# Patient Record
Sex: Male | Born: 1955
Health system: Southern US, Community
[De-identification: ages and names within clinical notes are randomized; demographics above are authoritative.]

## PROBLEM LIST (undated history)

## (undated) DIAGNOSIS — I201 Angina pectoris with documented spasm: Secondary | ICD-10-CM

## (undated) DIAGNOSIS — R911 Solitary pulmonary nodule: Secondary | ICD-10-CM

## (undated) DIAGNOSIS — Z720 Tobacco use: Secondary | ICD-10-CM

## (undated) DIAGNOSIS — M359 Systemic involvement of connective tissue, unspecified: Secondary | ICD-10-CM

## (undated) DIAGNOSIS — I10 Essential (primary) hypertension: Secondary | ICD-10-CM

## (undated) DIAGNOSIS — I2510 Atherosclerotic heart disease of native coronary artery without angina pectoris: Secondary | ICD-10-CM

---

## 2003-08-20 ENCOUNTER — Emergency Department (HOSPITAL_COMMUNITY): Admission: EM | Admit: 2003-08-20 | Discharge: 2003-08-20 | Payer: Self-pay | Admitting: Emergency Medicine

## 2003-08-20 ENCOUNTER — Encounter: Payer: Self-pay | Admitting: *Deleted

## 2012-07-21 ENCOUNTER — Encounter (HOSPITAL_COMMUNITY): Payer: Self-pay | Admitting: Emergency Medicine

## 2012-07-21 ENCOUNTER — Emergency Department (HOSPITAL_COMMUNITY)
Admission: EM | Admit: 2012-07-21 | Discharge: 2012-07-22 | Disposition: A | Discharge status: Home / Self Care | Payer: Managed Care, Other (non HMO) | Attending: Emergency Medicine | Admitting: Emergency Medicine

## 2012-07-21 ENCOUNTER — Emergency Department (HOSPITAL_COMMUNITY): Payer: Managed Care, Other (non HMO)

## 2012-07-21 DIAGNOSIS — L0390 Cellulitis, unspecified: Secondary | ICD-10-CM

## 2012-07-21 DIAGNOSIS — L02419 Cutaneous abscess of limb, unspecified: Secondary | ICD-10-CM | POA: Insufficient documentation

## 2012-07-21 DIAGNOSIS — F17200 Nicotine dependence, unspecified, uncomplicated: Secondary | ICD-10-CM | POA: Insufficient documentation

## 2012-07-21 LAB — CBC WITH DIFFERENTIAL/PLATELET
Basophils Absolute: 0.1 10*3/uL (ref 0.0–0.1)
Basophils Relative: 1 % (ref 0–1)
Eosinophils Absolute: 0.2 10*3/uL (ref 0.0–0.7)
HCT: 45.5 % (ref 39.0–52.0)
Hemoglobin: 15.6 g/dL (ref 13.0–17.0)
MCV: 95.6 fL (ref 78.0–100.0)
Neutrophils Relative %: 67 % (ref 43–77)
Platelets: 178 10*3/uL (ref 150–400)
RDW: 13.4 % (ref 11.5–15.5)

## 2012-07-21 MED ORDER — IBUPROFEN 800 MG PO TABS
800.0000 mg | ORAL_TABLET | Freq: Once | ORAL | Status: AC
  Administered 2012-07-21: 800 mg via ORAL
  Filled 2012-07-21: qty 1

## 2012-07-21 MED ORDER — HYDROCODONE-ACETAMINOPHEN 5-325 MG PO TABS
1.0000 | ORAL_TABLET | Freq: Once | ORAL | Status: AC
  Administered 2012-07-21: 1 via ORAL
  Filled 2012-07-21: qty 1

## 2012-07-21 NOTE — ED Notes (Signed)
C/o right medial ankle pain with redness and swelling, warm and tender to touch; denies injury; states onset this morning.  Pt states he thinks something bit him last night, causing the redness and swelling. +3 right pedal pulse.  C/o right heel pain x 6 months.

## 2012-07-21 NOTE — ED Provider Notes (Signed)
History   This chart was scribed for [employer]. Colon Branch, MD by Melba Coon. The patient was seen in room APA18/APA18 and the patient's care was started at 11:18PM.    CSN: [id number]  Arrival date & time 07/21/12  2153   First MD Initiated Contact with Patient 07/21/12 2302      Chief Complaint  Patient presents with  . Leg Pain    (Consider location/radiation/quality/duration/timing/severity/associated sxs/prior treatment) The history is provided by the patient. No language interpreter was used.   Christian Lewis is a 56 y.o. male who presents to the Emergency Department complaining of constant, moderate to severe right lower leg pain with an onset this morning. Pt states that he was at an outdoor party and thought that a bug must have bitten him and he did not notice it. No trauma or injury or falls to the affected area. Abx cream, applied alcohol, and "potato" applied to affected area did not alleviate the pain. Fever and chills present. No HA, neck pain, sore throat, rash, back pain, CP, SOB, abd pain, n/v/d, dysuria, or extremity edema, weakness, numbness, or tingling. No prior Hx of gout. No known allergies. No other pertinent medical symptoms.  PCP: Dr. Juanetta Gosling  History reviewed. No pertinent past medical history.  History reviewed. No pertinent past surgical history.  History reviewed. No pertinent family history.  History  Substance Use Topics  . Smoking status: Current Everyday Smoker  . Smokeless tobacco: Not on file  . Alcohol Use: Yes     occ      Review of Systems  Constitutional: Negative for fever.       10 Systems reviewed and are negative for acute change except as noted in the HPI.  HENT: Negative for congestion.   Eyes: Negative for discharge and redness.  Respiratory: Negative for cough and shortness of breath.   Cardiovascular: Negative for chest pain.  Gastrointestinal: Negative for vomiting and abdominal pain.  Musculoskeletal: Negative for back  pain.       Right ankle pain with redness. Right heel pain x 6 months.  Skin: Negative for rash.  Neurological: Negative for syncope, numbness and headaches.  Psychiatric/Behavioral:       No behavior change.     Allergies  Review of patient's allergies indicates no known allergies.  Home Medications   Current Outpatient Rx  Name Route Sig Dispense Refill  . ACETAMINOPHEN 500 MG PO TABS Oral Take 1,000 mg by mouth as needed. For pain      BP 126/74  Pulse 82  Temp 98.7 F (37.1 C) (Oral)  Resp 20  Ht 5\' 7"  (1.702 m)  Wt 135 lb (61.236 kg)  BMI 21.14 kg/m2  SpO2 99%  Physical Exam  Nursing note and vitals reviewed. Constitutional: He is oriented to person, place, and time. He appears well-developed and well-nourished. No distress.  HENT:  Head: Normocephalic and atraumatic.  Right Ear: External ear normal.  Left Ear: External ear normal.  Eyes: EOM are normal.  Neck: Normal range of motion. No tracheal deviation present.  Cardiovascular: Normal rate.   No murmur heard. Pulmonary/Chest: Effort normal. No respiratory distress.  Abdominal: Soft. There is no tenderness.  Musculoskeletal: Normal range of motion. He exhibits edema (Right ankle edema with warmth but no deformity, lesions or bites present.) and tenderness (RLE).       Erythema to right ankle  Neurological: He is alert and oriented to person, place, and time.  Skin: Skin is warm and dry.  No rash noted.  Psychiatric: He has a normal mood and affect. His behavior is normal.    ED Course  Procedures (including critical care time)  DIAGNOSTIC STUDIES: Oxygen Saturation is 99% on room air, normal by my interpretation.   Results for orders placed during the hospital encounter of 07/21/12  CBC WITH DIFFERENTIAL      Component Value Range   WBC 10.3  4.0 - 10.5 K/uL   RBC 4.76  4.22 - 5.81 MIL/uL   Hemoglobin 15.6  13.0 - 17.0 g/dL   HCT 11.9  14.7 - 82.9 %   MCV 95.6  78.0 - 100.0 fL   MCH 32.8  26.0 -  34.0 pg   MCHC 34.3  30.0 - 36.0 g/dL   RDW 56.2  13.0 - 86.5 %   Platelets 178  150 - 400 K/uL   Neutrophils Relative 67  43 - 77 %   Neutro Abs 6.9  1.7 - 7.7 K/uL   Lymphocytes Relative 21  12 - 46 %   Lymphs Abs 2.2  0.7 - 4.0 K/uL   Monocytes Relative 10  3 - 12 %   Monocytes Absolute 1.0  0.1 - 1.0 K/uL   Eosinophils Relative 2  0 - 5 %   Eosinophils Absolute 0.2  0.0 - 0.7 K/uL   Basophils Relative 1  0 - 1 %   Basophils Absolute 0.1  0.0 - 0.1 K/uL  Dg Ankle Complete Right  07/22/2012  *RADIOLOGY REPORT*  Clinical Data: Right ankle pain, swelling, redness since this morning.  No known injury.  Question insect bite.  RIGHT ANKLE - COMPLETE 3+ VIEW  Comparison: None.  Findings: There is soft tissue swelling along the medial aspect of the ankle.  No evidence for acute fracture, subluxation.  The mortise is intact.  Small plantar calcaneal spur is identified.  IMPRESSION: Soft tissue swelling. No evidence for acute osseous abnormality.  Original Report Authenticated By: Patterson Hammersmith, M.D.   COORDINATION OF CARE:   MDM Reviewed: nursing note and vitals Interpretation: x-ray and labs    11:23PM - EDMD will order hydrocodone-acetaminophen, ibuprofen, ankle XR, blood w/u, and UA for the pt.    MDM  Patient with sudden onset of erythema, tenderness and swelling to the right ankle area. NO evidence of insect bite however tender to palpation. Xray without evidence of fracture. Initiated antibiotic therapy. Given analgesic and antiinflammatory. Pt stable in ED with no significant deterioration in condition.The patient appears reasonably screened and/or stabilized for discharge and I doubt any other medical condition or other [facility] requiring further screening, evaluation, or treatment in the ED at this time prior to discharge.  I personally performed the services described in this documentation, which was scribed in my presence. The recorded information has been reviewed and  considered.        Nicoletta Dress. Colon Branch, MD 07/22/12 7846

## 2012-07-21 NOTE — ED Notes (Signed)
Patient states he woke up this morning and couldn't walk due to pain in his right lower leg. States "I was at a birthday party last night and something must have bitten me although I didn't feel anything." Lower right leg is swollen and reddened around ankle area. Also warm to touch. Patient also states "I have been unable to walk on that foot for about 6 months due to my heel hurting." Denies injury.

## 2012-07-21 NOTE — ED Notes (Signed)
Dr. Colon Branch at bedside to examine

## 2012-07-22 MED ORDER — HYDROCODONE-ACETAMINOPHEN 5-325 MG PO TABS: 1.0000 | ORAL_TABLET | ORAL | Status: AC | PRN

## 2012-07-22 MED ORDER — VANCOMYCIN HCL IN DEXTROSE 1-5 GM/200ML-% IV SOLN
1000.0000 mg | Freq: Once | INTRAVENOUS | Status: AC
  Administered 2012-07-22: 1000 mg via INTRAVENOUS
  Filled 2012-07-22: qty 200

## 2012-07-22 MED ORDER — SULFAMETHOXAZOLE-TRIMETHOPRIM 800-160 MG PO TABS: 1.0000 | ORAL_TABLET | Freq: Two times a day (BID) | ORAL | Status: AC

## 2012-07-22 NOTE — ED Notes (Signed)
Left in c/o family for transport home; a&ox4; reports pain improved; instructions/prescriptions reviewed and f/u information provided.  Verbalizes understanding.

## 2013-09-22 ENCOUNTER — Encounter (HOSPITAL_COMMUNITY): Payer: Self-pay | Admitting: *Deleted

## 2013-09-22 ENCOUNTER — Inpatient Hospital Stay (HOSPITAL_COMMUNITY)
Admission: EM | Admit: 2013-09-22 | Discharge: 2013-09-24 | DRG: 249 | Disposition: A | Discharge status: Home / Self Care | Payer: Managed Care, Other (non HMO) | Attending: Cardiovascular Disease | Admitting: Cardiovascular Disease

## 2013-09-22 ENCOUNTER — Emergency Department (HOSPITAL_COMMUNITY): Payer: Managed Care, Other (non HMO)

## 2013-09-22 DIAGNOSIS — I498 Other specified cardiac arrhythmias: Secondary | ICD-10-CM | POA: Diagnosis present

## 2013-09-22 DIAGNOSIS — F17200 Nicotine dependence, unspecified, uncomplicated: Secondary | ICD-10-CM | POA: Diagnosis present

## 2013-09-22 DIAGNOSIS — I201 Angina pectoris with documented spasm: Secondary | ICD-10-CM

## 2013-09-22 DIAGNOSIS — I200 Unstable angina: Secondary | ICD-10-CM | POA: Diagnosis present

## 2013-09-22 DIAGNOSIS — Z720 Tobacco use: Secondary | ICD-10-CM

## 2013-09-22 DIAGNOSIS — R911 Solitary pulmonary nodule: Secondary | ICD-10-CM

## 2013-09-22 DIAGNOSIS — I2510 Atherosclerotic heart disease of native coronary artery without angina pectoris: Secondary | ICD-10-CM

## 2013-09-22 DIAGNOSIS — I249 Acute ischemic heart disease, unspecified: Secondary | ICD-10-CM

## 2013-09-22 HISTORY — DX: Atherosclerotic heart disease of native coronary artery without angina pectoris: I25.10

## 2013-09-22 HISTORY — DX: Systemic involvement of connective tissue, unspecified: M35.9

## 2013-09-22 HISTORY — DX: Angina pectoris with documented spasm: I20.1

## 2013-09-22 HISTORY — DX: Essential (primary) hypertension: I10

## 2013-09-22 HISTORY — DX: Solitary pulmonary nodule: R91.1

## 2013-09-22 HISTORY — DX: Tobacco use: Z72.0

## 2013-09-22 LAB — MRSA PCR SCREENING: MRSA by PCR: NEGATIVE

## 2013-09-22 LAB — BASIC METABOLIC PANEL
BUN: 18 mg/dL (ref 6–23)
Chloride: 102 mEq/L (ref 96–112)
GFR calc Af Amer: >90 mL/min (ref >90)
Potassium: 3.8 mEq/L (ref 3.5–5.1)

## 2013-09-22 LAB — CBC WITH DIFFERENTIAL/PLATELET
Basophils Relative: 1 % (ref 0–1)
Hemoglobin: 14.7 g/dL (ref 13.0–17.0)
Lymphs Abs: 2.8 10*3/uL (ref 0.7–4.0)
MCHC: 34.4 g/dL (ref 30.0–36.0)
Monocytes Relative: 12 % (ref 3–12)
Neutro Abs: 3.5 10*3/uL (ref 1.7–7.7)
Neutrophils Relative %: 46 % (ref 43–77)
RBC: 4.46 MIL/uL (ref 4.22–5.81)
WBC: 7.6 10*3/uL (ref 4.0–10.5)

## 2013-09-22 MED ORDER — ATORVASTATIN CALCIUM 80 MG PO TABS
80.0000 mg | ORAL_TABLET | Freq: Every day | ORAL | Status: DC
  Administered 2013-09-22 – 2013-09-23 (×2): 80 mg via ORAL
  Filled 2013-09-22 (×3): qty 1

## 2013-09-22 MED ORDER — HEPARIN (PORCINE) IN NACL 100-0.45 UNIT/ML-% IJ SOLN: 12.0000 [IU]/kg/h | INTRAMUSCULAR | Status: DC

## 2013-09-22 MED ORDER — ACETAMINOPHEN 325 MG PO TABS
650.0000 mg | ORAL_TABLET | ORAL | Status: DC | PRN
  Administered 2013-09-23 – 2013-09-24 (×2): 650 mg via ORAL
  Filled 2013-09-22 (×2): qty 2

## 2013-09-22 MED ORDER — HEPARIN SODIUM (PORCINE) 5000 UNIT/ML IJ SOLN
60.0000 [IU]/kg | Freq: Once | INTRAMUSCULAR | Status: AC
  Administered 2013-09-22: 3750 [IU] via INTRAVENOUS

## 2013-09-22 MED ORDER — ASPIRIN EC 81 MG PO TBEC
81.0000 mg | DELAYED_RELEASE_TABLET | Freq: Every day | ORAL | Status: DC
  Administered 2013-09-24: 81 mg via ORAL
  Filled 2013-09-22 (×2): qty 1

## 2013-09-22 MED ORDER — SODIUM CHLORIDE 0.9 % IV SOLN: 250.0000 mL | INTRAVENOUS | Status: DC | PRN

## 2013-09-22 MED ORDER — METOPROLOL TARTRATE 25 MG PO TABS
25.0000 mg | ORAL_TABLET | Freq: Once | ORAL | Status: AC
  Administered 2013-09-22: 25 mg via ORAL
  Filled 2013-09-22: qty 1

## 2013-09-22 MED ORDER — SODIUM CHLORIDE 0.9 % IJ SOLN
3.0000 mL | INTRAMUSCULAR | Status: DC | PRN
  Administered 2013-09-23: 3 mL via INTRAVENOUS

## 2013-09-22 MED ORDER — ONDANSETRON HCL 4 MG/2ML IJ SOLN: 4.0000 mg | Freq: Four times a day (QID) | INTRAMUSCULAR | Status: DC | PRN

## 2013-09-22 MED ORDER — HEPARIN (PORCINE) IN NACL 100-0.45 UNIT/ML-% IJ SOLN
INTRAMUSCULAR | Status: AC
  Administered 2013-09-22: 750 [IU]/h via INTRAVENOUS
  Filled 2013-09-22: qty 250

## 2013-09-22 MED ORDER — NITROGLYCERIN IN D5W 200-5 MCG/ML-% IV SOLN
5.0000 ug/min | INTRAVENOUS | Status: DC
  Administered 2013-09-22: 5 ug/min via INTRAVENOUS

## 2013-09-22 MED ORDER — HEPARIN (PORCINE) IN NACL 100-0.45 UNIT/ML-% IJ SOLN
1050.0000 [IU]/h | INTRAMUSCULAR | Status: DC
  Administered 2013-09-22: 750 [IU]/h via INTRAVENOUS
  Filled 2013-09-22 (×2): qty 250

## 2013-09-22 MED ORDER — METOPROLOL TARTRATE 12.5 MG HALF TABLET
12.5000 mg | ORAL_TABLET | Freq: Two times a day (BID) | ORAL | Status: DC
  Filled 2013-09-22 (×3): qty 1

## 2013-09-22 MED ORDER — NITROGLYCERIN IN D5W 200-5 MCG/ML-% IV SOLN
INTRAVENOUS | Status: AC
  Administered 2013-09-22: 5 ug/min via INTRAVENOUS
  Filled 2013-09-22: qty 250

## 2013-09-22 MED ORDER — SODIUM CHLORIDE 0.9 % IJ SOLN: 3.0000 mL | Freq: Two times a day (BID) | INTRAMUSCULAR | Status: DC

## 2013-09-22 MED ORDER — ASPIRIN 81 MG PO CHEW
324.0000 mg | CHEWABLE_TABLET | Freq: Once | ORAL | Status: AC
  Administered 2013-09-22: 324 mg via ORAL
  Filled 2013-09-22: qty 4

## 2013-09-22 MED ORDER — ACTIVE PARTNERSHIP FOR HEALTH OF YOUR HEART BOOK
Freq: Once | Status: AC
  Administered 2013-09-22: 23:00:00
  Filled 2013-09-22: qty 1

## 2013-09-22 MED ORDER — NITROGLYCERIN 0.4 MG SL SUBL
0.4000 mg | SUBLINGUAL_TABLET | SUBLINGUAL | Status: DC | PRN
  Filled 2013-09-22: qty 25

## 2013-09-22 MED ORDER — NITROGLYCERIN IN D5W 200-5 MCG/ML-% IV SOLN: 10.0000 ug/min | Freq: Once | INTRAVENOUS | Status: DC

## 2013-09-22 NOTE — ED Notes (Signed)
Same, still waiting for bed assignment @ [facility]

## 2013-09-22 NOTE — ED Notes (Signed)
Patient with episode of chest pain while performing EKG. Noted EKG changes (ST elevation) during EKG. Pain subsided and EKG normalized. EKG obtained during pain and symptom free. Shown to Dr Fayrene Fearing.

## 2013-09-22 NOTE — H&P (Signed)
Christian Lewis is an 57 y.o. male.   Chief Complaint: chest pain HPI: Christian Lewis is a 57 yo man with PMH of who has been having CP intermittently for the last week. He describes the pain as sharp, radiating to his neck/throat but no real radiation down his arms presently or recently. At times he has had associated hot flashes, dizziness and light-headedness. The episodes last approximately 30 seconds at a time with more than 8 episodes today along. 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 He has no nausea/vomitnig/diarrhea and no fever/chills. He's never had pain like this before. He denies weight loss or change in appetite. He endorses 1-2 ppd since teenager. He has a strong family history of T2DM. He is currently chest pain free.      History reviewed. No pertinent past medical history.  History reviewed. No pertinent past surgical history.  History reviewed. No pertinent family history. Social History:  reports that he has been smoking.  He does not have any smokeless tobacco history on file. He reports that  drinks alcohol. He reports that he does not use illicit drugs. He has family history of T2DM  Allergies: No Known Allergies  Medications Prior to Admission  Medication Sig Dispense Refill  . pseudoephedrine (SUDAFED) 120 MG 12 hr tablet Take 60 mg by mouth daily as needed for congestion.      Marland Kitchen acetaminophen (TYLENOL) 500 MG tablet Take 1,000 mg by mouth as needed. For pain        Results for orders placed during the hospital encounter of 09/22/13 (from the past 48 hour(s))  POCT I-STAT TROPONIN I     Status: None   Collection Time    09/22/13  6:04 PM      Result Value Range   Troponin i, poc 0.01  0.00 - 0.08 ng/mL   Comment 3            Comment: Due to the release kinetics of cTnI,     a negative result within the first hours     of the onset of symptoms does not rule out     myocardial infarction with certainty.     If myocardial  infarction is still suspected,     repeat the test at appropriate intervals.  CBC WITH DIFFERENTIAL     Status: None   Collection Time    09/22/13  6:06 PM      Result Value Range   WBC 7.6  4.0 - 10.5 K/uL   RBC 4.46  4.22 - 5.81 MIL/uL   Hemoglobin 14.7  13.0 - 17.0 g/dL   HCT 16.1  09.6 - 04.5 %   MCV 95.7  78.0 - 100.0 fL   MCH 33.0  26.0 - 34.0 pg   MCHC 34.4  30.0 - 36.0 g/dL   RDW 40.9  81.1 - 91.4 %   Platelets 178  150 - 400 K/uL   Neutrophils Relative % 46  43 - 77 %   Neutro Abs 3.5  1.7 - 7.7 K/uL   Lymphocytes Relative 37  12 - 46 %   Lymphs Abs 2.8  0.7 - 4.0 K/uL   Monocytes Relative 12  3 - 12 %   Monocytes Absolute 0.9  0.1 - 1.0 K/uL   Eosinophils Relative 4  0 - 5 %   Eosinophils Absolute 0.3  0.0 - 0.7 K/uL   Basophils Relative 1  0 -  1 %   Basophils Absolute 0.1  0.0 - 0.1 K/uL  BASIC METABOLIC PANEL     Status: Abnormal   Collection Time    09/22/13  6:06 PM      Result Value Range   Sodium 139  135 - 145 mEq/L   Potassium 3.8  3.5 - 5.1 mEq/L   Chloride 102  96 - 112 mEq/L   CO2 29  19 - 32 mEq/L   Glucose, Bld 115 (*) 70 - 99 mg/dL   BUN 18  6 - 23 mg/dL   Creatinine, Ser 1.61  0.50 - 1.35 mg/dL   Calcium 9.7  8.4 - 09.6 mg/dL   GFR calc non Af Amer >90  >90 mL/min   GFR calc Af Amer >90  >90 mL/min   Comment: (NOTE)     The eGFR has been calculated using the CKD EPI equation.     This calculation has not been validated in all clinical situations.     eGFR's persistently <90 mL/min signify possible Chronic Kidney     Disease.   Dg Chest Portable 1 View  09/22/2013   *RADIOLOGY REPORT*  Clinical Data: Chest pain  PORTABLE CHEST - 1 VIEW  Comparison: None  Findings: Normal cardiac and mediastinal contours. Suggestion of increased density in the paramediastinal right upper lobe.  No consolidative pulmonary opacities. Possible 5 mm left lower lobe pulmonary nodule.  No pleural effusion or pneumothorax.  Regional skeleton is unremarkable.  Biapical  pleural parenchymal thickening.  IMPRESSION: No acute cardiopulmonary process.  Suggestion of density within the paramediastinal right upper lobe likely secondary to vessels. Possible 5 mm left lower lobe pulmonary nodule.  Recommend correlation with PA and lateral chest radiograph when patient able.   Original Report Authenticated By: Annia Belt, M.D    Review of Systems  Constitutional: Negative for fever, chills and weight loss.  HENT: Negative for hearing loss, neck pain and tinnitus.   Eyes: Negative for double vision.  Cardiovascular: Positive for chest pain. Negative for palpitations, orthopnea and leg swelling.  Gastrointestinal: Negative for heartburn, nausea, vomiting, abdominal pain, diarrhea, blood in stool and melena.  Genitourinary: Negative for dysuria, frequency and hematuria.  Musculoskeletal: Negative for myalgias.  Neurological: Negative for dizziness and tingling.  Endo/Heme/Allergies: Negative for environmental allergies. Does not bruise/bleed easily.  Psychiatric/Behavioral: Positive for substance abuse. Negative for depression and suicidal ideas.    Blood pressure 109/65, pulse 50, temperature 97.4 F (36.3 C), temperature source Oral, resp. rate 16, height 5\' 6"  (1.676 m), weight 60.8 kg (134 lb 0.6 oz), SpO2 99.00%. Physical Exam  Nursing note and vitals reviewed. Constitutional: He is oriented to person, place, and time. No distress.  Thin man, fully conversant in NAD  HENT:  Head: Normocephalic and atraumatic.  Nose: Nose normal.  Mouth/Throat: Oropharynx is clear and moist. No oropharyngeal exudate.  Eyes: Conjunctivae and EOM are normal. Pupils are equal, round, and reactive to light. No scleral icterus.  Neck: Normal range of motion. Neck supple. No JVD present. No thyromegaly present.  Cardiovascular: Normal rate, regular rhythm, normal heart sounds and intact distal pulses.  Exam reveals no gallop.   No murmur heard. Respiratory: Effort normal and breath  sounds normal. No respiratory distress. He has no wheezes.  GI: Soft. Bowel sounds are normal. He exhibits no distension. There is no tenderness.  Musculoskeletal: Normal range of motion. He exhibits no edema and no tenderness.  Neurological: He is alert and oriented to person, place, and time.  Coordination normal.  Skin: Skin is warm and dry. No rash noted. He is not diaphoretic. No erythema.  Psychiatric: He has a normal mood and affect. His behavior is normal.    Labs reviewed; wbc 7.6, h/h 14.7/42.7, plt 178, na 139, K 3.8, bun/cr 18/0.82, calcium 9.7 Troponin 0.01 ECG: initial inferior ST elevation, repeat 1 minute later resolved inferior ST elevation with no chest pain Chest x-ray: ? LLL 5 mm nodule on portable  Problem List Chest Pain/Unstable Angina Transient ST elevation on ECG Tobacco abuse ? LLL nodule on portable Assessment/Plan 57 yo man with PMH of tobacco use, family history of T2DM here with recurrent chest pain and transient episode of ST elevation on ECG currently being treated for unstable angina. Differential diagnosis also includes esophageal spasm, GERD, nonobstructive CAD, prinzmetal's angina among other etiologies. I favor a diagnosis of coronary disease with real culprit disease given symptoms, risk factors and transient ECG findings. Currently on heparin/HTG gtt. If symptoms recur low threshold for cath lab activation. Likely LHC in AM. His symptoms are a bit atypical but he has strong risk factors and brief ECG changes.  - continue heparin gtt, NTG gtt - low threshold for LHC urgently if change in symptoms - tsh, bnp, lipid panel, BNP, hba1c - defer Echo given likely LHC in AM with potenital LV-gram - aspirin 81 mg daily; high dose atorvastatin - telemetry - low dose metoprolol to be held for HR < 55-60   Tehillah Cipriani 09/22/2013, 10:26 PM

## 2013-09-22 NOTE — ED Notes (Signed)
Chest pain , that radiates to throat, and both arms

## 2013-09-22 NOTE — Progress Notes (Signed)
ANTICOAGULATION CONSULT NOTE - Initial Consult  Pharmacy Consult for Heparin Indication: chest pain/ACS  No Known Allergies  Patient Measurements: Height: 5\' 6"  (167.6 cm) Weight: 138 lb (62.596 kg) IBW/kg (Calculated) : 63.8  Vital Signs: Temp: 98.7 F (37.1 C) (09/30 1753) Temp src: Oral (09/30 1753) BP: 152/76 mmHg (09/30 1753) Pulse Rate: 70 (09/30 1753)  Labs: No results found for this basename: HGB, HCT, PLT, APTT, LABPROT, INR, HEPARINUNFRC, CREATININE, CKTOTAL, CKMB, TROPONINI,  in the last 72 hours  CrCl is unknown because no creatinine reading has been taken.   Medical History: History reviewed. No pertinent past medical history.  Medications:  Scheduled:  . heparin  60 Units/kg Intravenous Once  . metoprolol tartrate  25 mg Oral Once    Assessment: 57 yo M who presented with chest pain found to have abnormal EKG changes on exam.  Troponin currently wnl.  No history of bleeding noted.  Heparin bolus ordered by EDP.  CBC in progress.   Goal of Therapy:  Heparin level 0.3-0.7 units/ml Monitor platelets by anticoagulation protocol: Yes   Plan:  If platelets > 100K, start heparin infusion at 750 units/hr (12 units/kg/hr) Check 6 hour heparin level after infusion started Daily heparin level & CBC while on heparin infusion  Elson Clan 09/22/2013,6:26 PM

## 2013-09-22 NOTE — ED Provider Notes (Signed)
CSN: [id number]     Arrival date & time 09/22/13  1744 History  This chart was scribed for Roney Marion, MD by Blanchard Kelch, ED Scribe. The patient was seen in room APA18/APA18. Patient's care was started at 5:59 PM.    Chief Complaint  Patient presents with  . Chest Pain   Patient is a 57 y.o. male presenting with chest pain. The history is provided by the patient. No language interpreter was used.  Chest Pain Associated symptoms: dizziness   Associated symptoms: no abdominal pain, no cough, no diaphoresis, no dysphagia, no fatigue, no fever, no headache, no nausea, no shortness of breath and not vomiting     HPI Comments: Christian Lewis is a 57 y.o. male who presents to the Emergency Department complaining of intermittent, worsening bilateral chest pain that began about a week ago. The pain radiates up his throat and down his arms bilaterally. He describes the pain as sharp. He reports getting hot flashes, dizziness and light-headed with the episodes. The episodes typically last about 30 seconds. He reports having 8 episodes of pain today. He is not currently in pain. He denies any recent head injury or trauma. He denies syncope, diaphoresis, nausea, hematuria, or hematemesis. He reports diabetes runs in his family. He denies a family history of heart problems. He denies knowing if he is diabetic or hypertensive. 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 He is a current smoker.   History reviewed. No pertinent past medical history. History reviewed. No pertinent past surgical history. History reviewed. No pertinent family history. History  Substance Use Topics  . Smoking status: Current Every Day Smoker  . Smokeless tobacco: Not on file  . Alcohol Use: Yes     Comment: occ    Review of Systems  Constitutional: Negative for fever, chills, diaphoresis, appetite change and fatigue.  HENT: Negative for sore throat, mouth sores and trouble swallowing.   Eyes: Negative for visual disturbance.   Respiratory: Negative for cough, chest tightness, shortness of breath and wheezing.   Cardiovascular: Positive for chest pain.  Gastrointestinal: Negative for nausea, vomiting, abdominal pain, diarrhea and abdominal distention.  Endocrine: Negative for polydipsia, polyphagia and polyuria.  Genitourinary: Negative for dysuria, frequency and hematuria.  Musculoskeletal: Negative for gait problem.  Skin: Negative for color change, pallor and rash.  Neurological: Positive for dizziness and light-headedness. Negative for syncope and headaches.  Hematological: Does not bruise/bleed easily.  Psychiatric/Behavioral: Negative for behavioral problems and confusion.    Allergies  Review of patient's allergies indicates no known allergies.  Home Medications   Current Outpatient Rx  Name  Route  Sig  Dispense  Refill  . acetaminophen (TYLENOL) 500 MG tablet   Oral   Take 1,000 mg by mouth as needed. For pain          Triage Vitals: BP 152/76  Pulse 70  Temp(Src) 98.7 F (37.1 C) (Oral)  Resp 18  Ht 5\' 6"  (1.676 m)  Wt 138 lb (62.596 kg)  BMI 22.28 kg/m2  SpO2 97%  Physical Exam  Nursing note and vitals reviewed. Constitutional: He is oriented to person, place, and time. He appears well-developed and well-nourished. No distress.  HENT:  Head: Normocephalic.  Eyes: Conjunctivae are normal. Pupils are equal, round, and reactive to light. No scleral icterus.  Neck: Normal range of motion. Neck supple. No JVD present. No tracheal deviation present. No thyromegaly present.  Cardiovascular: Normal rate, regular rhythm and intact distal pulses.  Exam reveals  no gallop and no friction rub.   No murmur heard. Pulmonary/Chest: Effort normal and breath sounds normal. No respiratory distress. He has no wheezes. He has no rales.  Abdominal: Soft. Bowel sounds are normal. He exhibits no distension. There is no tenderness. There is no rebound.  Musculoskeletal: Normal range of motion. He exhibits  no edema.  Neurological: He is alert and oriented to person, place, and time.  Skin: Skin is warm and dry. No rash noted.  Psychiatric: He has a normal mood and affect. His behavior is normal.    ED Course  CRITICAL CARE Performed by: Roney Marion Authorized by: Rolland Porter J Total critical care time: 55 minutes Critical care start time: 09/22/2013 5:55 PM Critical care end time: 09/22/2013 6:50 PM Critical care time was exclusive of separately billable procedures and treating other patients. Critical care was necessary to treat or prevent imminent or life-threatening deterioration of the following conditions: Coronary syndrome with dynamic EKG. Critical care was time spent personally by me on the following activities: development of treatment plan with patient or surrogate, obtaining history from patient or surrogate, ordering and performing treatments and interventions and ordering and review of laboratory studies. Comments: Patient placed on vasoactive infusions with nitroglycerin drip. Heparin bolus and drip.   (including critical care time)  DIAGNOSTIC STUDIES: Oxygen Saturation is 97% on room air, normal by my interpretation.    COORDINATION OF CARE: 6:05 PM -Will order EKG, CBC, BMP, I-Stat troponin, Chest x-ray, and aspirin. Patient verbalizes understanding and agrees with treatment plan.  EKG: #1: ST elevations of 1 mm in leads 2, 3, and aVF.  EKG #2: This is obtained less than 1 minute after the first. His pain resolved. His ST segments had normalized. His ST segments are isoelectric.    Labs Review Labs Reviewed  BASIC METABOLIC PANEL - Abnormal; Notable for the following:    Glucose, Bld 115 (*)    All other components within normal limits  CBC WITH DIFFERENTIAL  HEPARIN LEVEL (UNFRACTIONATED)  CBC  POCT I-STAT TROPONIN I   Imaging Review Dg Chest Portable 1 View  09/22/2013   *RADIOLOGY REPORT*  Clinical Data: Chest pain  PORTABLE CHEST - 1 VIEW  Comparison:  None  Findings: Normal cardiac and mediastinal contours. Suggestion of increased density in the paramediastinal right upper lobe.  No consolidative pulmonary opacities. Possible 5 mm left lower lobe pulmonary nodule.  No pleural effusion or pneumothorax.  Regional skeleton is unremarkable.  Biapical pleural parenchymal thickening.  IMPRESSION: No acute cardiopulmonary process.  Suggestion of density within the paramediastinal right upper lobe likely secondary to vessels. Possible 5 mm left lower lobe pulmonary nodule.  Recommend correlation with PA and lateral chest radiograph when patient able.   Original Report Authenticated By: Annia Belt, M.D    MDM   1. Acute coronary syndrome     I placed a call to Dr. Casimiro Needle he was on-call for invasive cardiology. He returned the call immediately. We discussed the patient. Patient currently has no pain he has a normal EKG. When having episodes of pain he does have ST elevations. The pet these have completely resolved. We decided to not make him a cardiac alert as he is symptom-free. He is given aspirin by mouth, Lopressor by mouth, IV heparin bolus and infusion, and nitroglycerin infusion. He'll be transferred via ground ambulance to come to the CCU in the care of Dr. Shirlee Latch rate remains symptom free.  Discussion:   His chest x-ray shows a  pulmonary nodule and PA and lateral for followup x-rays recommended I discussed this with him. Rest and to followup with her primary care physician regarding this if unable to get PA and lateral chest x-ray during this hospitalization.   I personally performed the services described in this documentation, which was scribed in my presence. The recorded information has been reviewed and is accurate.    Roney Marion, MD 09/22/13 [phone]

## 2013-09-22 NOTE — ED Notes (Signed)
Resting quietly, pain free. Waiting for bed assign @ [facility]. Pt aware of same

## 2013-09-23 ENCOUNTER — Encounter (HOSPITAL_COMMUNITY)
Admission: EM | Disposition: A | Discharge status: Home / Self Care | Payer: Self-pay | Source: Home / Self Care | Attending: Cardiovascular Disease

## 2013-09-23 ENCOUNTER — Encounter (HOSPITAL_COMMUNITY): Payer: Self-pay | Admitting: Nurse Practitioner

## 2013-09-23 ENCOUNTER — Inpatient Hospital Stay (HOSPITAL_COMMUNITY): Payer: Managed Care, Other (non HMO)

## 2013-09-23 DIAGNOSIS — I2510 Atherosclerotic heart disease of native coronary artery without angina pectoris: Secondary | ICD-10-CM

## 2013-09-23 DIAGNOSIS — Z720 Tobacco use: Secondary | ICD-10-CM

## 2013-09-23 DIAGNOSIS — I200 Unstable angina: Secondary | ICD-10-CM

## 2013-09-23 DIAGNOSIS — I2119 ST elevation (STEMI) myocardial infarction involving other coronary artery of inferior wall: Secondary | ICD-10-CM

## 2013-09-23 HISTORY — PX: FRACTIONAL FLOW RESERVE WIRE: SHX5839

## 2013-09-23 HISTORY — PX: PERCUTANEOUS CORONARY STENT INTERVENTION (PCI-S): SHX5485

## 2013-09-23 HISTORY — PX: LEFT HEART CATHETERIZATION WITH CORONARY ANGIOGRAM: SHX5451

## 2013-09-23 LAB — RAPID URINE DRUG SCREEN, HOSP PERFORMED
Amphetamines: NOT DETECTED
Opiates: NOT DETECTED
Tetrahydrocannabinol: NOT DETECTED

## 2013-09-23 LAB — CBC
HCT: 40.6 % (ref 39.0–52.0)
Hemoglobin: 14.2 g/dL (ref 13.0–17.0)
RBC: 4.27 MIL/uL (ref 4.22–5.81)
WBC: 8.7 10*3/uL (ref 4.0–10.5)

## 2013-09-23 LAB — LIPID PANEL
Cholesterol: 118 mg/dL (ref 0–200)
LDL Cholesterol: 65 mg/dL (ref 0–99)
Total CHOL/HDL Ratio: 3.2 RATIO
Triglycerides: 81 mg/dL (ref <150)
VLDL: 16 mg/dL (ref 0–40)

## 2013-09-23 LAB — PROTIME-INR: Prothrombin Time: 13.2 seconds (ref 11.6–15.2)

## 2013-09-23 LAB — BASIC METABOLIC PANEL
CO2: 28 mEq/L (ref 19–32)
Chloride: 105 mEq/L (ref 96–112)
Creatinine, Ser: 0.7 mg/dL (ref 0.50–1.35)
Sodium: 138 mEq/L (ref 135–145)

## 2013-09-23 LAB — HEPARIN LEVEL (UNFRACTIONATED): Heparin Unfractionated: 0.24 IU/mL — ABNORMAL LOW (ref 0.30–0.70)

## 2013-09-23 LAB — POCT ACTIVATED CLOTTING TIME
Activated Clotting Time: 257 seconds
Activated Clotting Time: 262 seconds

## 2013-09-23 LAB — TSH: TSH: 7.79 u[IU]/mL — ABNORMAL HIGH (ref 0.350–4.500)

## 2013-09-23 LAB — TROPONIN I: Troponin I: <0.3 ng/mL (ref <0.30)

## 2013-09-23 LAB — MAGNESIUM: Magnesium: 2.1 mg/dL (ref 1.5–2.5)

## 2013-09-23 LAB — HEMOGLOBIN A1C: Mean Plasma Glucose: 120 mg/dL — ABNORMAL HIGH (ref <117)

## 2013-09-23 SURGERY — LEFT HEART CATHETERIZATION WITH CORONARY ANGIOGRAM
Anesthesia: LOCAL

## 2013-09-23 MED ORDER — SODIUM CHLORIDE 0.9 % IV SOLN: INTRAVENOUS | Status: DC

## 2013-09-23 MED ORDER — SODIUM CHLORIDE 0.9 % IJ SOLN: 3.0000 mL | Freq: Two times a day (BID) | INTRAMUSCULAR | Status: DC

## 2013-09-23 MED ORDER — AMLODIPINE BESYLATE 2.5 MG PO TABS
2.5000 mg | ORAL_TABLET | Freq: Every day | ORAL | Status: DC
  Administered 2013-09-23 – 2013-09-24 (×2): 2.5 mg via ORAL
  Filled 2013-09-23 (×2): qty 1

## 2013-09-23 MED ORDER — HEPARIN (PORCINE) IN NACL 2-0.9 UNIT/ML-% IJ SOLN
INTRAMUSCULAR | Status: AC
  Filled 2013-09-23: qty 1000

## 2013-09-23 MED ORDER — SODIUM CHLORIDE 0.9 % IV SOLN: 250.0000 mL | INTRAVENOUS | Status: DC | PRN

## 2013-09-23 MED ORDER — ASPIRIN 81 MG PO CHEW
324.0000 mg | CHEWABLE_TABLET | ORAL | Status: AC
  Administered 2013-09-23: 324 mg via ORAL

## 2013-09-23 MED ORDER — PRASUGREL HCL 10 MG PO TABS
ORAL_TABLET | ORAL | Status: AC
  Filled 2013-09-23: qty 6

## 2013-09-23 MED ORDER — ISOSORBIDE MONONITRATE ER 30 MG PO TB24
30.0000 mg | ORAL_TABLET | Freq: Every day | ORAL | Status: DC
  Administered 2013-09-23 – 2013-09-24 (×2): 30 mg via ORAL
  Filled 2013-09-23 (×2): qty 1

## 2013-09-23 MED ORDER — SODIUM CHLORIDE 0.9 % IV SOLN
INTRAVENOUS | Status: AC
Start: 2013-09-23 — End: 2013-09-23
  Administered 2013-09-23: 21:00:00 250 mL via INTRAVENOUS

## 2013-09-23 MED ORDER — SODIUM CHLORIDE 0.9 % IJ SOLN
3.0000 mL | Freq: Two times a day (BID) | INTRAMUSCULAR | Status: DC
Start: 2013-09-23 — End: 2013-09-23

## 2013-09-23 MED ORDER — FENTANYL CITRATE 0.05 MG/ML IJ SOLN
INTRAMUSCULAR | Status: AC
  Filled 2013-09-23: qty 2

## 2013-09-23 MED ORDER — VERAPAMIL HCL 2.5 MG/ML IV SOLN
INTRAVENOUS | Status: AC
  Filled 2013-09-23: qty 2

## 2013-09-23 MED ORDER — NITROGLYCERIN 0.2 MG/ML ON CALL CATH LAB
INTRAVENOUS | Status: AC
  Filled 2013-09-23: qty 1

## 2013-09-23 MED ORDER — SODIUM CHLORIDE 0.9 % IJ SOLN: 3.0000 mL | INTRAMUSCULAR | Status: DC | PRN

## 2013-09-23 MED ORDER — ASPIRIN 81 MG PO CHEW
CHEWABLE_TABLET | ORAL | Status: AC
  Filled 2013-09-23: qty 4

## 2013-09-23 MED ORDER — PRASUGREL HCL 10 MG PO TABS
10.0000 mg | ORAL_TABLET | Freq: Every day | ORAL | Status: DC
  Administered 2013-09-24: 11:00:00 10 mg via ORAL
  Filled 2013-09-23: qty 1

## 2013-09-23 MED ORDER — HEPARIN SODIUM (PORCINE) 1000 UNIT/ML IJ SOLN
INTRAMUSCULAR | Status: AC
  Filled 2013-09-23: qty 1

## 2013-09-23 MED ORDER — MIDAZOLAM HCL 2 MG/2ML IJ SOLN
INTRAMUSCULAR | Status: AC
  Filled 2013-09-23: qty 2

## 2013-09-23 MED ORDER — ASPIRIN 81 MG PO CHEW
CHEWABLE_TABLET | ORAL | Status: AC
  Filled 2013-09-23: qty 1

## 2013-09-23 MED ORDER — LIDOCAINE HCL (PF) 1 % IJ SOLN
INTRAMUSCULAR | Status: AC
  Filled 2013-09-23: qty 30

## 2013-09-23 MED ORDER — ADENOSINE 12 MG/4ML IV SOLN
12.0000 mL | Freq: Once | INTRAVENOUS | Status: DC
  Filled 2013-09-23: qty 12

## 2013-09-23 NOTE — Progress Notes (Signed)
TR BAND REMOVAL  LOCATION:  right radial  DEFLATED PER PROTOCOL:  yes  TIME BAND OFF / DRESSING APPLIED:   1415   SITE UPON ARRIVAL:   Level 0  SITE AFTER BAND REMOVAL:  Level 0  REVERSE ALLEN'S TEST:    positive  CIRCULATION SENSATION AND MOVEMENT:  Within Normal Limits  yes  COMMENTS:

## 2013-09-23 NOTE — Progress Notes (Signed)
Notified C.Berg NP of abnormal CXR and CT recommended. He will continue to follow up.

## 2013-09-23 NOTE — Progress Notes (Addendum)
ANTICOAGULATION CONSULT NOTE - Follow Up Consult  Pharmacy Consult for Heparin Indication: chest pain/ACS  No Known Allergies  Patient Measurements: Height: 5\' 6"  (167.6 cm) Weight: 134 lb 0.6 oz (60.8 kg) IBW/kg (Calculated) : 63.8  Vital Signs: Temp: 97.4 F (36.3 C) (10/01 0042) Temp src: Oral (10/01 0042) BP: 93/53 mmHg (10/01 0042) Pulse Rate: 47 (10/01 0042)  Labs:  Recent Labs  09/22/13 1806 09/22/13 2330  HGB 14.7  --   HCT 42.7  --   PLT 178  --   APTT  --  155*  LABPROT  --  13.2  INR  --  1.02  HEPARINUNFRC  --  0.24*  CREATININE 0.82  --   TROPONINI  --  <0.30    Estimated Creatinine Clearance: 84.4 ml/min (by C-G formula based on Cr of 0.82).   Medical History: History reviewed. No pertinent past medical history.  Medications:  Scheduled:  . aspirin EC  81 mg Oral Daily  . atorvastatin  80 mg Oral q1800  . metoprolol tartrate  12.5 mg Oral BID  . sodium chloride  3 mL Intravenous Q12H    Assessment: 57 yo M who presented with chest pain found to have abnormal EKG changes on exam.  Troponin currently wnl.  No history of bleeding noted.  Initial heparin level 0.24 units/ml    Goal of Therapy:  Heparin level 0.3-0.7 units/ml Monitor platelets by anticoagulation protocol: Yes   Plan:  Increase heparin infusion to 900 units/hr Check 6 hour heparin level after rate change   Ericia Moxley Poteet 09/23/2013,12:51 AM  Addum:  Repeat heparin level 0.25 units/ml.  Will increase drip to 1050 units/hr and recheck in 6 hours.

## 2013-09-23 NOTE — CV Procedure (Signed)
Cardiac Catheterization Procedure Note  Name: Christian Lewis MRN: [id number] DOB: 23-Sep-1956  Procedure: Left Heart Cath, Selective Coronary Angiography, LV angiography,  fractional flow reserve interrogation of the proximal RCA,  bare-metal stenting of the right coronary artery  Indication: Unstable angina with transient  inferior ST elevation.  Medications:  Sedation:  1 mg IV Versed, 50 mcg IV Fentanyl  Contrast:  130 ml Omnipaque  Procedural Details: The right wrist was prepped, draped, and anesthetized with 1% lidocaine. Using the modified Seldinger technique, a 5 French Slender sheath was introduced into the right radial artery. 3 mg of verapamil was administered through the sheath, weight-based unfractionated heparin was administered intravenously. A Jackie catheter was used for selective coronary angiography. A pigtail catheter was used for left ventriculography. Catheter exchanges were performed over an exchange length guidewire. There were no immediate procedural complications.  Procedural Findings:  Hemodynamics: AO:  95/46 mmHg LV:  95/2    mmHg LVEDP: 5  mmHg  Coronary angiography: Coronary dominance: Right   Left Main:  Normal  Left Anterior Descending (LAD):  Normal in size with minor irregularities.  1st diagonal (D1):  Small in size with minor irregularities.  2nd diagonal (D2):  Small in size with minor irregularities.  3rd diagonal (D3):  Small in size with minor irregularities.  Circumflex (LCx):  Normal in size and nondominant. The vessel has minor irregularities.  1st obtuse marginal:  Medium in size with no significant disease.  2nd obtuse marginal:  Small in size with no significant disease.  3rd obtuse marginal:  Small in size with no significant disease.    Ramus Intermedius:  Large in size with no significant disease.  Right Coronary Artery: Large in size and dominant. There is a 50-60% discrete proximal stenosis. The rest of the vessel has  minor irregularities.  Posterior descending artery: Large in size with no significant disease.  Posterior AV segment: Normal  Posterolateral branchs:  Normal posterolateral branches.  Left ventriculography: Left ventricular systolic function is normal , LVEF is estimated at 60 %, there is no significant mitral regurgitation   PCI Note:  Following the diagnostic procedure, the decision was made to proceed with hemodynamic evaluation of the lesion in the right coronary artery. ACT was 257. An additional 2000 units of heparin was given before stenting the vessel.  A 6 [place] JR 4 guide catheter was inserted.  A intuition coronary guidewire was used to cross the lesion.  I then used the Acist FFR catheter which was used in the standard fashion and cross the lesion. IV adenosine at 140 mcg per kilogram per minute was started. FFR ratio was 0.77. After this, there was a severe drop in FFR to 0.5 with documented severe spasm in the proximal lesion. Based on this, I decided to treat the lesion.  The lesion was then stented with a 3.5 x 18 mm vision bare-metal stent.  The stent was postdilated with a 4.0 x 12 noncompliant balloon.  Following PCI, there was 0% residual stenosis and TIMI-3 flow. Final angiography confirmed an excellent result. The patient tolerated the procedure well. There were no immediate procedural complications. A TR band was used for radial hemostasis. The patient was transferred to the post catheterization recovery area for further monitoring.  PCI Data: Vessel - proximal RCA/Segment - 1 Percent Stenosis (pre)  60% (significant by FFR) TIMI-flow 3 Stent 3.5 x 18 mm vision bare-metal stent Percent Stenosis (post) 0 TIMI-flow (post) 3   Final Conclusions:  1. Severe  proximal RCA spasm superimposed on a moderate lesion which was significant by FFR interrogation. No other significant coronary disease. 2. Normal LV systolic function and left ventricular end-diastolic pressure. 3.  Successful bare-metal stent placement to the proximal right coronary artery.  Recommendations:  This is a case of severe coronary spasm. I will check urine drug screen. Avoid beta blockers. He is to stop using all kinds of decongestants as he was taking Sudafed. Treat with dual antiplatelet therapy for a minimum of one month. I started small dose Imdur and and amlodipine.  Lorine Bears MD, [facility] 09/23/2013, 11:17 AM

## 2013-09-23 NOTE — Progress Notes (Signed)
Christian Lewis, H 09/23/2013

## 2013-09-23 NOTE — Progress Notes (Addendum)
Patient Name: Christian Lewis Date of Encounter: 09/23/2013    Principal Problem:   Unstable angina Active Problems:   Tobacco abuse   SUBJECTIVE  No chest pain or sob overnight.  CURRENT MEDS . aspirin EC  81 mg Oral Daily  . atorvastatin  80 mg Oral q1800  . metoprolol tartrate  12.5 mg Oral BID  . sodium chloride  3 mL Intravenous Q12H   OBJECTIVE  Filed Vitals:   09/23/13 0300 09/23/13 0500 09/23/13 0530 09/23/13 0600  BP: 90/50 116/57 107/53 117/64  Pulse: 47 46 46 42  Temp:   97.6 F (36.4 C)   TempSrc:   Oral   Resp:   17   Height:      Weight:  134 lb 0.6 oz (60.8 kg)    SpO2: 99% 99% 98% 98%    Intake/Output Summary (Last 24 hours) at 09/23/13 0656 Last data filed at 09/23/13 0600  Gross per 24 hour  Intake    360 ml  Output    600 ml  Net   -240 ml   Filed Weights   09/22/13 1753 09/22/13 2209 09/23/13 0500  Weight: 138 lb (62.596 kg) 134 lb 0.6 oz (60.8 kg) 134 lb 0.6 oz (60.8 kg)    PHYSICAL EXAM  General: Pleasant, NAD. Neuro: Alert and oriented X 3. Moves all extremities spontaneously. Psych: Normal affect. HEENT:  Normal  Neck: Supple without bruits or JVD. Lungs:  Resp regular and unlabored, CTA. Heart: RRR no s3, s4, or murmurs. Abdomen: Soft, non-tender, non-distended, BS + x 4.  Extremities: No clubbing, cyanosis or edema. DP/PT/Radials 2+ and equal bilaterally.  Accessory Clinical Findings  CBC  Recent Labs  09/22/13 1806 09/23/13 0546  WBC 7.6 8.7  NEUTROABS 3.5  --   HGB 14.7 14.2  HCT 42.7 40.6  MCV 95.7 95.1  PLT 178 161   Basic Metabolic Panel  Recent Labs  09/22/13 1806 09/22/13 2330 09/23/13 0546  NA 139  --  138  K 3.8  --  3.8  CL 102  --  105  CO2 29  --  28  GLUCOSE 115*  --  99  BUN 18  --  20  CREATININE 0.82  --  0.70  CALCIUM 9.7  --  8.7  MG  --  2.1  --    Cardiac Enzymes  Recent Labs  09/22/13 2330 09/23/13 0546  TROPONINI <0.30 <0.30   Fasting Lipid Panel  Recent Labs  09/23/13 0546  CHOL 118  HDL 37*  LDLCALC 65  TRIG 81  CHOLHDL 3.2   TELE  Sinus brady into the 40's.  ECG  Pending this AM.  Radiology/Studies  Dg Chest Portable 1 View  09/22/2013   *RADIOLOGY REPORT*  Clinical Data: Chest pain  PORTABLE CHEST - 1 VIEW  Comparison: None  Findings: Normal cardiac and mediastinal contours. Suggestion of increased density in the paramediastinal right upper lobe.  No consolidative pulmonary opacities. Possible 5 mm left lower lobe pulmonary nodule.  No pleural effusion or pneumothorax.  Regional skeleton is unremarkable.  Biapical pleural parenchymal thickening.  IMPRESSION: No acute cardiopulmonary process.  Suggestion of density within the paramediastinal right upper lobe likely secondary to vessels. Possible 5 mm left lower lobe pulmonary nodule.  Recommend correlation with PA and lateral chest radiograph when patient able.   Original Report Authenticated By: Annia Belt, M.D    ASSESSMENT AND PLAN  1.  [place]:  Pt presented yesterday with multiple episodes of  sharp left sided chest pain radiating down his right arm, associated with dyspnea.  Episodes only lasted ~ 30-45 seconds and resolved with rest and deep breathing.  During an episode in the ED, he was noted to have inferior ST elevation with lateral ST depression.  These changes resolved as pain resolved ~ 20 seconds later.  No recurrent c/p overnight on hep/ntg.  CE neg.  Ss and dynamic ECG changes concerning for ischemia.  Plan to pursue diagnostic cath today.  Cont heparin/asa/ntg/statin.  D/c BB in setting of baseline bradycardia.  2.  Tob Abuse:  Cessation advised.  3.  Lipids:  LDL 65 - prev statin naive.  Cont statin for now pending cath.  4.  Abnl CXR: ? 5mm LLL nodule.  F/U PA/Lat as rec.  Signed, Nicolasa Ducking NP   The patient was seen, examined and discussed with Ward Givens, NP.  Agree with the above. 57 year old male, smoker who presented with resting episodic retrosternal  chest pains radiating to the back lasting 45 seconds with documented STE in the inferior leads. Troponin I negative x 2. I just witnessed another of those episodes. He probably has a Prinzmetal angina, that usually has a atherosclerotic substrate. The plan is to perform cath ASAP. Even if findings show non-obstructive CAD he will need aggressive medical management and smoking cessation. He will be started on CCB if vasospasm confermed.  Tobias Alexander, H 09/23/2013

## 2013-09-24 ENCOUNTER — Inpatient Hospital Stay (HOSPITAL_COMMUNITY): Payer: Managed Care, Other (non HMO)

## 2013-09-24 ENCOUNTER — Encounter (HOSPITAL_COMMUNITY): Payer: Self-pay | Admitting: Nurse Practitioner

## 2013-09-24 DIAGNOSIS — R911 Solitary pulmonary nodule: Secondary | ICD-10-CM

## 2013-09-24 DIAGNOSIS — I2510 Atherosclerotic heart disease of native coronary artery without angina pectoris: Secondary | ICD-10-CM

## 2013-09-24 DIAGNOSIS — I201 Angina pectoris with documented spasm: Secondary | ICD-10-CM

## 2013-09-24 LAB — CBC
MCV: 95.9 fL (ref 78.0–100.0)
Platelets: 171 10*3/uL (ref 150–400)
RBC: 4.37 MIL/uL (ref 4.22–5.81)
WBC: 9.4 10*3/uL (ref 4.0–10.5)

## 2013-09-24 LAB — BASIC METABOLIC PANEL
BUN: 17 mg/dL (ref 6–23)
CO2: 26 mEq/L (ref 19–32)
Chloride: 104 mEq/L (ref 96–112)
GFR calc Af Amer: >90 mL/min (ref >90)
GFR calc non Af Amer: >90 mL/min (ref >90)
Potassium: 4 mEq/L (ref 3.5–5.1)
Sodium: 139 mEq/L (ref 135–145)

## 2013-09-24 LAB — T4, FREE: Free T4: 1.03 ng/dL (ref 0.80–1.80)

## 2013-09-24 MED ORDER — NITROGLYCERIN 0.4 MG SL SUBL: 0.4000 mg | SUBLINGUAL_TABLET | SUBLINGUAL | Status: DC | PRN

## 2013-09-24 MED ORDER — ASPIRIN 81 MG PO TBEC: 81.0000 mg | DELAYED_RELEASE_TABLET | Freq: Every day | ORAL | Status: AC

## 2013-09-24 MED ORDER — ISOSORBIDE MONONITRATE ER 30 MG PO TB24: 30.0000 mg | ORAL_TABLET | Freq: Every day | ORAL | Status: DC

## 2013-09-24 MED ORDER — AMLODIPINE BESYLATE 2.5 MG PO TABS: 2.5000 mg | ORAL_TABLET | Freq: Every day | ORAL | Status: DC

## 2013-09-24 MED ORDER — PRASUGREL HCL 10 MG PO TABS: 10.0000 mg | ORAL_TABLET | Freq: Every day | ORAL | Status: DC

## 2013-09-24 MED ORDER — IOHEXOL 300 MG/ML  SOLN
80.0000 mL | Freq: Once | INTRAMUSCULAR | Status: AC | PRN
  Administered 2013-09-24: 80 mL via INTRAVENOUS

## 2013-09-24 MED ORDER — ATORVASTATIN CALCIUM 40 MG PO TABS: 40.0000 mg | ORAL_TABLET | Freq: Every day | ORAL | Status: DC

## 2013-09-24 NOTE — Progress Notes (Signed)
Tele has been alternating between SB and wandering atrial pacemaker, hr 45-50's.  Slept for few hours, now awake, c/o headache.  Tylenol given po.  BP 98/50.

## 2013-09-24 NOTE — Progress Notes (Signed)
CARDIAC REHAB PHASE I   PRE:  Rate/Rhythm: 52 SB    BP: sitting 113/46    SaO2:   MODE:  Ambulation: 700 ft   POST:  Rate/Rhythm: 61 SB    BP: sitting 134/65     SaO2:   Tolerated well, no c/o. Ed completed. Question pts ability to comply to heart healthy living. Sts he is thinking about quitting smoking. Gave him methods for success and [id number] number. Not interested in CRPII. [phone]   Elissa Lovett [place] CES, ACSM 09/24/2013 8:42 AM

## 2013-09-24 NOTE — Progress Notes (Signed)
Patient Name: Christian Lewis Date of Encounter: 09/24/2013   Principal Problem:   Unstable angina Active Problems:   CAD (coronary artery disease)   Coronary vasospasm   Tobacco abuse   SUBJECTIVE  No chest pain or sob overnight.  Hasn't ambulated yet.  CURRENT MEDS . amLODipine  2.5 mg Oral Daily  . aspirin EC  81 mg Oral Daily  . atorvastatin  80 mg Oral q1800  . isosorbide mononitrate  30 mg Oral Daily  . prasugrel  10 mg Oral Daily   OBJECTIVE  Filed Vitals:   09/23/13 2118 09/23/13 2300 09/24/13 0059 09/24/13 0650  BP: 105/49  98/50 107/55  Pulse: 57 48 52 48  Temp: 97.8 F (36.6 C)  98.3 F (36.8 C) 97.7 F (36.5 C)  TempSrc: Oral  Oral Oral  Resp: 16  18 20   Height:      Weight:   135 lb 9.3 oz (61.5 kg)   SpO2: 95% 95% 97% 98%    Intake/Output Summary (Last 24 hours) at 09/24/13 0706 Last data filed at 09/24/13 0653  Gross per 24 hour  Intake 1213.75 ml  Output   2650 ml  Net -1436.25 ml   Filed Weights   09/22/13 2209 09/23/13 0500 09/24/13 0059  Weight: 134 lb 0.6 oz (60.8 kg) 134 lb 0.6 oz (60.8 kg) 135 lb 9.3 oz (61.5 kg)   PHYSICAL EXAM  General: Pleasant, NAD. Neuro: Alert and oriented X 3. Moves all extremities spontaneously. Psych: Normal affect. HEENT:  Normal  Neck: Supple without bruits or JVD. Lungs:  Resp regular and unlabored, CTA. Heart: RRR no s3, s4, or murmurs. Abdomen: Soft, non-tender, non-distended, BS + x 4.  Extremities: No clubbing, cyanosis or edema. DP/PT/Radials 2+ and equal bilaterally.  R wrist w/o bleeding/bruit/hematoma.  Accessory Clinical Findings  CBC  Recent Labs  09/22/13 1806 09/23/13 0546 09/24/13 0544  WBC 7.6 8.7 9.4  NEUTROABS 3.5  --   --   HGB 14.7 14.2 14.5  HCT 42.7 40.6 41.9  MCV 95.7 95.1 95.9  PLT 178 161 171   Basic Metabolic Panel  Recent Labs  09/22/13 1806 09/22/13 2330 09/23/13 0546 09/24/13 0544  NA 139  --  138 139  K 3.8  --  3.8 4.0  CL 102  --  105 104  CO2 29   --  28 26  GLUCOSE 115*  --  99 100*  BUN 18  --  20 17  CREATININE 0.82  --  0.70 0.76  CALCIUM 9.7  --  8.7 8.9  MG  --  2.1  --   --    Cardiac Enzymes  Recent Labs  09/22/13 2330 09/23/13 0546 09/23/13 1230  TROPONINI <0.30 <0.30 <0.30   Hemoglobin A1C  Recent Labs  09/22/13 2330  HGBA1C 5.8*   Fasting Lipid Panel  Recent Labs  09/23/13 0546  CHOL 118  HDL 37*  LDLCALC 65  TRIG 81  CHOLHDL 3.2   Thyroid Function Tests  Recent Labs  09/22/13 2330  TSH 7.790*   TELE  Sb, high 40's to 50's.  ECG  Sb, 48, no acute st/t changes.  Radiology/Studies  Dg Chest 2 View  09/23/2013   *RADIOLOGY REPORT*  Clinical Data: Questioned small nodule in the left lung on prior.  CHEST - 2 VIEW  Comparison: Prior radiograph 09/22/2013  Findings: Normal cardiac and mediastinal contours.  No consolidative pulmonary opacities. Previously questioned nodular density may correspond with a 1 cm nodular  density projecting over the lower lungs on the lateral view.  No pleural effusion or pneumothorax.  Regional skeleton is unremarkable.  IMPRESSION: No acute cardiopulmonary process.  Questioned nodular density may correspond with a 1 cm nodular density projecting over the spine on the lateral view in the region of the lower lungs.  Further evaluation with chest CT is recommended.  These results will be called to the ordering clinician or representative by the Radiologist Assistant, and communication documented in the PACS Dashboard.   Original Report Authenticated By: Annia Belt, M.D   ASSESSMENT AND PLAN  1.  [place]/CAD/Coronary Vasospasm:  S/p PCI/BMS to the proximal RCA yesterday.  No recurrent chest pain.  Cont asa, effient, statin, ccb, nitrate.  2.  Lung Nodule:  1mm nodule noted on PA/Lat CXR yesterday.  Radiology recs f/u CT.  Will order this AM.  3.  Tob Abuse:  Cessation advised.  4.  Elevated TSH:  F/u Free T4.  5.  Lipids:  LDL 65 (TC 118).  Cont statin in setting of  #1.  Signed, Nicolasa Ducking NP    The patient was seen, examined and discussed with Ward Givens, PA-C and I agree with the above.  Tobias Alexander, H 09/24/2013

## 2013-09-24 NOTE — Discharge Summary (Signed)
Patient ID: Christian Lewis,  MRN: [id number], DOB/AGE: 1956/04/30 57 y.o.  Admit date: 09/22/2013 Discharge date: 09/24/2013  Primary Care Provider: HAWKINS,EDWARD L Primary Cardiologist: Pt will w/u in [place]  Discharge Diagnoses Principal Problem:   Unstable angina  **s/p PCI/BMS of the RCA this admission.  Active Problems:   CAD (coronary artery disease)   Coronary vasospasm   Tobacco abuse   Pulmonary nodule  **Needs f/u CT of chest with contrast in 6-12 months.  Allergies No Known Allergies  Procedures  PA & Lateral CXR 10.1.2014  IMPRESSION: No acute cardiopulmonary process.  Questioned nodular density may correspond with a 1 cm nodular density projecting over the spine on the lateral view in the region of the lower lungs.  Further evaluation with chest CT is recommended. _____________   Cardiac Catheterization and Percutaneous Coronary Intervention 10.1.2014  Coronary angiography: Coronary dominance: Right     Left Main:  Normal  Left Anterior Descending (LAD):  Normal in size with minor irregularities.  1st diagonal (D1):  Small in size with minor irregularities.  2nd diagonal (D2):  Small in size with minor irregularities.  3rd diagonal (D3):  Small in size with minor irregularities.  Circumflex (LCx):  Normal in size and nondominant. The vessel has minor irregularities.  1st obtuse marginal:  Medium in size with no significant disease.  2nd obtuse marginal:  Small in size with no significant disease.  3rd obtuse marginal:  Small in size with no significant disease.       Ramus Intermedius:  Large in size with no significant disease.  Right Coronary Artery: Large in size and dominant. There is a 50-60% discrete proximal stenosis. The rest of the vessel has minor irregularities.  Posterior descending artery: Large in size with no significant disease.  Posterior AV segment: Normal  Posterolateral branchs:  Normal posterolateral  branches.    **Fractional Flow Reserve was performed within the RCA.  FFR dropped from 0.77 to 0.5 with adenosine administration secondary to severe spasm within the proximal lesion.  The proximal RCA was then stented with a 3.5 x 18 mm Vision bare metal stent.  Left ventriculography: Left ventricular systolic function is normal , LVEF is estimated at 60 %, there is no significant mitral regurgitation  _____________   CT of the Chest with Contrast 10.2.2014  IMPRESSION: 1. No acute findings identified.  2. Small nonspecific pulmonary nodules are noted in the left lower Lobe (4 mm LLL parenchymal nodule and a 6 mm nodule in the left base). If the patient is at high risk for bronchogenic carcinoma, follow-up chest CT at 6-12 months is recommended. If the patient is at low risk for bronchogenic carcinoma, follow-up chest CT at 12 months is recommended.  _____________   History of Present Illness  57 year old male with prior history of tobacco abuse but without prior history of coronary artery disease. He was in his usual state of health until approximately one week prior to admission when he began to experience intermittent chest discomfort, initially occurring with activity but later occurring at rest as well. Symptoms were described as sharp, fleeting pain, and dyspnea lasting approximately 30-45 seconds, and resolving spontaneously. He had multiple episodes on the day of admission prompting him to present to the [facility] where, during a brief episode of chest pain, ECG showed inferior ST segment elevation which resolved within 30 seconds, with resolution of symptoms. Patient was placed on aspirin and heparin therapy and admitted for further evaluation.  Hospital Course  Patient  ruled out for myocardial infarction. He had recurrent chest pain on the morning of October 1 and was noted upon entry at that time to recurrent ST elevation which was again transient. Decision was made to pursue  semiurgent diagnostic catheterization which revealed predominantly nonobstructive coronary artery disease with a 50-60% within the proximal right coronary artery. fractional flow reserve was performed within the lesion in the right coronary artery and reduced from 0.77-0.5 with adenosine administration secondary to severe spasm at the lesion site. Decision was made at that point to pursue percutaneous intervention and a 3.5 x 18 mm vision bare-metal stent was successfully placed and post dilated up to 4 mm. Patient tolerated this procedure well and post procedure has had no recurrence of chest pain.  He has been ambulating without difficulty.   Of note, portable chest x-ray performed in the emergency department suggested a possible 5 mm left lower lobe nodule. A followup PA and lateral chest x-ray showed a 1 cm nodular density projecting over the spine on the lateral view in the region of the lower lungs. At that point radiology recommended CT with contrast to further assess this nodule. CT with contrast of the chest was performed this morning and showed a 4 mm LLL parenchymal nodule and a 6 mm nodule in the left base.  Radiology recommends follow-up CT of the Chest with contrast in 6-12 months, given his h/o tobacco abuse.  Patient has been notified of results and understands.  He will be discharged home today in good condition.  Discharge Vitals Blood pressure 134/65, pulse 59, temperature 97.7 F (36.5 C), temperature source Oral, resp. rate 20, height 5\' 6"  (1.676 m), weight 135 lb 9.3 oz (61.5 kg), SpO2 98.00%.  Filed Weights   09/22/13 2209 09/23/13 0500 09/24/13 0059  Weight: 134 lb 0.6 oz (60.8 kg) 134 lb 0.6 oz (60.8 kg) 135 lb 9.3 oz (61.5 kg)   Labs  CBC  Recent Labs  09/22/13 1806 09/23/13 0546 09/24/13 0544  WBC 7.6 8.7 9.4  NEUTROABS 3.5  --   --   HGB 14.7 14.2 14.5  HCT 42.7 40.6 41.9  MCV 95.7 95.1 95.9  PLT 178 161 171   Basic Metabolic Panel  Recent Labs   40/10/27 1806 09/22/13 2330 09/23/13 0546 09/24/13 0544  NA 139  --  138 139  K 3.8  --  3.8 4.0  CL 102  --  105 104  CO2 29  --  28 26  GLUCOSE 115*  --  99 100*  BUN 18  --  20 17  CREATININE 0.82  --  0.70 0.76  CALCIUM 9.7  --  8.7 8.9  MG  --  2.1  --   --    Cardiac Enzymes  Recent Labs  09/22/13 2330 09/23/13 0546 09/23/13 1230  TROPONINI <0.30 <0.30 <0.30   Hemoglobin A1C  Recent Labs  09/22/13 2330  HGBA1C 5.8*   Fasting Lipid Panel  Recent Labs  09/23/13 0546  CHOL 118  HDL 37*  LDLCALC 65  TRIG 81  CHOLHDL 3.2   Thyroid Function Tests  Recent Labs  09/22/13 2330  TSH 7.790*   Disposition  Pt is being discharged home today in good condition.  Follow-up Plans & Appointments      Follow-up Information   Follow up with Jacolyn Reedy, PA-C On 09/30/2013. (1:00 PM)    Contact information:   [facility] [address] [place] [address] [phone]     Discharge  Medications    Medication List    STOP taking these medications       pseudoephedrine 120 MG 12 hr tablet  Commonly known as:  SUDAFED      TAKE these medications       acetaminophen 500 MG tablet  Commonly known as:  TYLENOL  Take 1,000 mg by mouth as needed. For pain     amLODipine 2.5 MG tablet  Commonly known as:  NORVASC  Take 1 tablet (2.5 mg total) by mouth daily.     aspirin 81 MG EC tablet  Take 1 tablet (81 mg total) by mouth daily.     atorvastatin 40 MG tablet  Commonly known as:  LIPITOR  Take 1 tablet (40 mg total) by mouth daily at 6 PM.     isosorbide mononitrate 30 MG 24 hr tablet  Commonly known as:  IMDUR  Take 1 tablet (30 mg total) by mouth daily.     nitroGLYCERIN 0.4 MG SL tablet  Commonly known as:  NITROSTAT  Place 1 tablet (0.4 mg total) under the tongue every 5 (five) minutes x 3 doses as needed for chest pain.     prasugrel 10 MG Tabs tablet  Commonly known as:  EFFIENT  Take 1 tablet (10 mg total) by mouth daily.        Outstanding Labs/Studies  Follow-up Chest CT with contrast in 6-12 mos related to LLL pulmonary nodules.  Duration of Discharge Encounter   Greater than 30 minutes including physician time.  Signed, Christian Ducking NP 09/24/2013, 1:34 PM    The patient was seen, examined and discussed with Ward Givens, PA-C and I agree with the above.  Christian Lewis, H 09/24/2013

## 2013-09-30 ENCOUNTER — Encounter: Payer: Self-pay | Admitting: Physician Assistant

## 2013-09-30 ENCOUNTER — Encounter: Payer: Self-pay | Admitting: *Deleted

## 2013-09-30 ENCOUNTER — Ambulatory Visit (INDEPENDENT_AMBULATORY_CARE_PROVIDER_SITE_OTHER): Payer: Managed Care, Other (non HMO) | Admitting: Physician Assistant

## 2013-09-30 VITALS — BP 96/55 | HR 64 | Ht 67.0 in | Wt 135.8 lb

## 2013-09-30 DIAGNOSIS — Z720 Tobacco use: Secondary | ICD-10-CM

## 2013-09-30 DIAGNOSIS — F17200 Nicotine dependence, unspecified, uncomplicated: Secondary | ICD-10-CM

## 2013-09-30 DIAGNOSIS — I201 Angina pectoris with documented spasm: Secondary | ICD-10-CM

## 2013-09-30 DIAGNOSIS — I2510 Atherosclerotic heart disease of native coronary artery without angina pectoris: Secondary | ICD-10-CM

## 2013-09-30 DIAGNOSIS — R911 Solitary pulmonary nodule: Secondary | ICD-10-CM

## 2013-09-30 NOTE — Patient Instructions (Addendum)
Your physician recommends that you schedule a follow-up appointment in: 1 month  Your physician recommends that you return for lab work in 1 month. LFT/LIPID  Your physician recommends you quit smoking.

## 2013-09-30 NOTE — Assessment & Plan Note (Signed)
Smoking cessation discussed 

## 2013-09-30 NOTE — Assessment & Plan Note (Addendum)
He was found to have severe proximal RCA spasm superimposed on a moderate lesion which was significant by FFR interrogation. He was treated with bare-metal stent to the proximal RCA. He had normal LV function and LV and diastolic pressure. Patient is on Imdur, Effient and Norvasc. Beta blockers and decongestants are to be avoided. He has had no further chest pain. Continue current treatment. Will give him a note to return to work. We'll check fasting lipid panel and LFT's  in 4 weeks

## 2013-09-30 NOTE — Progress Notes (Signed)
HPI:  This is a 57 year old male patient who was recently hospitalized with unstable angina and was found to have severe proximal RCA spasm superimposed on a moderate lesion which was significant by FFR interrogation. He was treated with bare-metal stent to the proximal RCA. He had normal LV function and LV and diastolic pressure. It was recommended that beta blockers be avoided and he stopped using all kinds of decongestants. He was placed on Imdur and amlodipine.  The patient was also found to have a 4 mm left lower lobe parenchymal nodule and a 6 mm nodule in the left base. Radiology recommends a followup CT of the chest with contrast in 6-12 months given his tobacco history.  The patient has done quite well since she's been home from the hospital. He denies any chest pain, palpitations, dyspnea, dyspnea on exertion, or presyncope. He actually tried to go back to work but they turned him down because he needs a note. He has decreased his cigarette intake to 6-7 cigarettes a day. He says he is trying to quit. No Known Allergies  Current Outpatient Prescriptions on File Prior to Visit: acetaminophen (TYLENOL) 500 MG tablet, Take 1,000 mg by mouth as needed. For pain, Disp: , Rfl:  amLODipine (NORVASC) 2.5 MG tablet, Take 1 tablet (2.5 mg total) by mouth daily., Disp: 30 tablet, Rfl: 6 aspirin EC 81 MG EC tablet, Take 1 tablet (81 mg total) by mouth daily., Disp: , Rfl:  atorvastatin (LIPITOR) 40 MG tablet, Take 1 tablet (40 mg total) by mouth daily at 6 PM., Disp: 30 tablet, Rfl: 6 isosorbide mononitrate (IMDUR) 30 MG 24 hr tablet, Take 1 tablet (30 mg total) by mouth daily., Disp: 30 tablet, Rfl: 6 nitroGLYCERIN (NITROSTAT) 0.4 MG SL tablet, Place 1 tablet (0.4 mg total) under the tongue every 5 (five) minutes x 3 doses as needed for chest pain., Disp: 25 tablet, Rfl: 3 prasugrel (EFFIENT) 10 MG TABS tablet, Take 1 tablet (10 mg total) by mouth daily., Disp: 30 tablet, Rfl: 6  No current  facility-administered medications on file prior to visit.   Past Medical History:   CAD (coronary artery disease)                                  Comment:a. 09/2013 Cath/PCI: LM nl, LAD min irregs,               D1/2/3 small, min irregs, LCX min irregs,               OM1/2/3 nl, RI nl, RCA 50-60p w severe spasm on              FFR (0.77->0.5)-->3.5x18 Vision BMS, EF 60%.   Coronary vasospasm                                           Lung nodule                                                    Comment:a. 09/2013 CT chest with contrast: 4mm and 6mm               LLL nodules **Needs f/u CT  in 6-12 mos.   Tobacco abuse                                                Collagen vascular disease                                    Hypertension                                                No past surgical history on file.  No family history on file.   Social History   Marital Status: Married             Spouse Name:                      Years of Education:                 Number of children:             Occupational History   None on file  Social History Main Topics   Smoking Status: Current Every Day Smoker        Packs/Day: 0.00  Years:         Smokeless Status: Not on file                      Alcohol Use: Yes               Comment: occ   Drug Use: No             Sexual Activity: Not on file        Other Topics            Concern   None on file  Social History Narrative   None on file    ROS: See history of present illness otherwise negative   PHYSICAL EXAM: Well-nournished, in no acute distress. Neck: No JVD, HJR, Bruit, or thyroid enlargement  Lungs: Decreased breath sounds but No tachypnea, clear without wheezing, rales, or rhonchi  Cardiovascular: RRR, PMI not displaced, heart sounds normal, no murmurs, gallops, bruit, thrill, or heave.  Abdomen: BS normal. Soft without organomegaly, masses, lesions or tenderness.  Extremities: Right arm without hematoma or  hemorrhage at cath site, good radial and brachial pulses, lower extremities without cyanosis, clubbing or edema. Good distal pulses bilateral  SKin: Warm, no lesions or rashes   Musculoskeletal: No deformities  Neuro: no focal signs  BP 96/55  Pulse 64  Ht 5\' 7"  (1.702 m)  Wt 135 lb 12 oz (61.576 kg)  BMI 21.26 kg/m2    EKG: Normal sinus rhythm  Cardiac cath 09-23-13 Final Conclusions:  1. Severe proximal RCA spasm superimposed on a moderate lesion which was significant by FFR interrogation. No other significant coronary disease. 2. Normal LV systolic function and left ventricular end-diastolic pressure. 3. Successful bare-metal stent placement to the proximal right coronary artery.  Recommendations:  This is a case of severe coronary spasm. I will check urine drug screen. Avoid beta blockers. He is to stop using all kinds of decongestants as he was taking Sudafed. Treat  with dual antiplatelet therapy for a minimum of one month. I started small dose Imdur and and amlodipine.  Lorine Bears MD, [facility] CT with contrast of the chest was performed this morning and showed a 4 mm LLL parenchymal nodule and a 6 mm nodule in the left base.  Radiology recommends follow-up CT of the Chest with contrast in 6-12 months, given his h/o tobacco abuse.  Patient has been notified of results and understands

## 2013-09-30 NOTE — Assessment & Plan Note (Signed)
Followup CT with contrast in 6 months

## 2013-10-21 ENCOUNTER — Encounter: Payer: Self-pay | Admitting: Cardiovascular Disease

## 2013-10-21 ENCOUNTER — Ambulatory Visit (INDEPENDENT_AMBULATORY_CARE_PROVIDER_SITE_OTHER): Payer: Managed Care, Other (non HMO) | Admitting: Cardiovascular Disease

## 2013-10-21 VITALS — BP 105/61 | HR 75 | Ht 67.0 in | Wt 134.8 lb

## 2013-10-21 DIAGNOSIS — Z789 Other specified health status: Secondary | ICD-10-CM

## 2013-10-21 DIAGNOSIS — Z888 Allergy status to other drugs, medicaments and biological substances status: Secondary | ICD-10-CM

## 2013-10-21 DIAGNOSIS — E785 Hyperlipidemia, unspecified: Secondary | ICD-10-CM

## 2013-10-21 DIAGNOSIS — I201 Angina pectoris with documented spasm: Secondary | ICD-10-CM

## 2013-10-21 DIAGNOSIS — I2510 Atherosclerotic heart disease of native coronary artery without angina pectoris: Secondary | ICD-10-CM

## 2013-10-21 MED ORDER — ROSUVASTATIN CALCIUM 5 MG PO TABS: 5.0000 mg | ORAL_TABLET | Freq: Every day | ORAL | Status: DC

## 2013-10-21 MED ORDER — PRASUGREL HCL 10 MG PO TABS: 10.0000 mg | ORAL_TABLET | Freq: Every day | ORAL | Status: DC

## 2013-10-21 NOTE — Patient Instructions (Signed)
   Your physician recommends that you schedule a follow-up appointment in: 4 months. Your physician has recommended you make the following change in your medication: STOP LIPITOR. START CRESTOR 5 MG DAILY. Your new prescription has been sent to your pharmacy. All other medications will remain the same.

## 2013-10-21 NOTE — Progress Notes (Signed)
Patient ID: Christian Lewis, male   DOB: 09-07-1956, 57 y.o.   MRN: [id number]      SUBJECTIVE: This is a 57 year old male patient who was recently hospitalized with unstable angina and was found to have severe proximal RCA spasm superimposed on a moderate lesion which was significant by FFR interrogation. He was treated with a bare-metal stent to the proximal RCA. He had normal LV function and LV and diastolic pressure. It was recommended that beta blockers be avoided and he stopped using all kinds of decongestants. He was placed on Imdur and amlodipine.  The patient was also found to have a 4 mm left lower lobe parenchymal nodule and a 6 mm nodule in the left base. Radiology recommends a followup CT of the chest with contrast in 6-12 months given his tobacco history.  He currently denies any chest pain, palpitations, dyspnea, dyspnea on exertion, or presyncope. He has decreased his cigarette intake to 6-7 cigarettes a day. He says he is trying to quit.  However, he has been experiencing diffuse muscle and joint aching since starting these new medications, and he says the Lipitor causes him fatigue and scalp and neck aching.    No Known Allergies  Current Outpatient Prescriptions  Medication Sig Dispense Refill  . amLODipine (NORVASC) 2.5 MG tablet Take 1 tablet (2.5 mg total) by mouth daily.  30 tablet  6  . aspirin EC 81 MG EC tablet Take 1 tablet (81 mg total) by mouth daily.      Marland Kitchen atorvastatin (LIPITOR) 40 MG tablet Take 1 tablet (40 mg total) by mouth daily at 6 PM.  30 tablet  6  . isosorbide mononitrate (IMDUR) 30 MG 24 hr tablet Take 1 tablet (30 mg total) by mouth daily.  30 tablet  6  . nitroGLYCERIN (NITROSTAT) 0.4 MG SL tablet Place 1 tablet (0.4 mg total) under the tongue every 5 (five) minutes x 3 doses as needed for chest pain.  25 tablet  3  . prasugrel (EFFIENT) 10 MG TABS tablet Take 1 tablet (10 mg total) by mouth daily.  30 tablet  6  . acetaminophen (TYLENOL) 500 MG tablet  Take 1,000 mg by mouth as needed. For pain       No current facility-administered medications for this visit.    Past Medical History  Diagnosis Date  . CAD (coronary artery disease)     a. 09/2013 Cath/PCI: LM nl, LAD min irregs, D1/2/3 small, min irregs, LCX min irregs, OM1/2/3 nl, RI nl, RCA 50-60p w severe spasm on FFR (0.77->0.5)-->3.5x18 Vision BMS, EF 60%.  . Coronary vasospasm   . Lung nodule     a. 09/2013 CT chest with contrast: 4mm and 6mm LLL nodules **Needs f/u CT in 6-12 mos.  . Tobacco abuse   . Collagen vascular disease   . Hypertension     No past surgical history on file.  History   Social History  . Marital Status: Married    Spouse Name: N/A    Number of Children: N/A  . Years of Education: N/A   Occupational History  . Not on file.   Social History Main Topics  . Smoking status: Current Every Day Smoker -- 0.25 packs/day for 40 years  . Smokeless tobacco: Not on file     Comment: pt notes slowed down to 6 cigs daily 09-24-13  . Alcohol Use: Yes     Comment: occ  . Drug Use: No  . Sexual Activity: Not on file  Other Topics Concern  . Not on file   Social History Narrative  . No narrative on file     Filed Vitals:   10/21/13 1152  BP: 105/61  Pulse: 75  Height: 5\' 7"  (1.702 m)  Weight: 134 lb 12 oz (61.122 kg)    PHYSICAL EXAM General: NAD Neck: No JVD, no thyromegaly or thyroid nodule.  Lungs: Clear to auscultation bilaterally with normal respiratory effort. CV: Nondisplaced PMI.  Heart regular S1/S2, no S3/S4, no murmur.  No peripheral edema.  No carotid bruit.  Normal pedal pulses.  Abdomen: Soft, nontender, no hepatosplenomegaly, no distention.  Neurologic: Alert and oriented x 3.  Psych: Normal affect. Extremities: No clubbing or cyanosis.   ECG: reviewed and available in electronic records.      ASSESSMENT AND PLAN: 1. CAD with severe RCA spasm: continue low-dose amlodipine and Imdur. I'm unable to increase his  amlodipine due to relative hypotension. Continue both ASA and Effient, given recent bare metal stent placement. Due to fatigue and diffuse muscle aches, I will d/c Lipitor and start low-dose Crestor to see if he tolerates this. I will refill his Effient. Beta blockers and decongestants are to be avoided.  2. Hyperlipidemia: appears to be intolerant to Lipitor. I will try initiating low-dose Crestor 5 mg daily. Will need repeat lipids and LFT's in near future.   Prentice Docker, M.D., F.A.C.C.

## 2013-10-26 ENCOUNTER — Encounter: Payer: Self-pay | Admitting: *Deleted

## 2013-10-26 ENCOUNTER — Other Ambulatory Visit: Payer: Self-pay | Admitting: *Deleted

## 2013-10-26 DIAGNOSIS — Z720 Tobacco use: Secondary | ICD-10-CM

## 2013-10-26 DIAGNOSIS — I2510 Atherosclerotic heart disease of native coronary artery without angina pectoris: Secondary | ICD-10-CM

## 2013-10-26 DIAGNOSIS — I201 Angina pectoris with documented spasm: Secondary | ICD-10-CM

## 2013-10-26 DIAGNOSIS — R911 Solitary pulmonary nodule: Secondary | ICD-10-CM

## 2013-10-30 ENCOUNTER — Ambulatory Visit: Payer: Managed Care, Other (non HMO) | Admitting: Cardiovascular Disease

## 2013-11-04 ENCOUNTER — Encounter: Payer: Self-pay | Admitting: Physician Assistant

## 2013-11-11 ENCOUNTER — Other Ambulatory Visit: Payer: Self-pay | Admitting: *Deleted

## 2013-11-11 ENCOUNTER — Telehealth: Payer: Self-pay | Admitting: *Deleted

## 2013-11-11 NOTE — Telephone Encounter (Signed)
Pt walked into office stated he started taking Crestor 5 mg daily. Pt states that the medicaton "knocks him out", that when he sits down he just falls asleep. Pt is taking medication every other day now due to falling asleep. Please advise if any changes.

## 2013-11-13 NOTE — Telephone Encounter (Signed)
Pt is aware of Dr. Verna Czech recommendations Mylo Red RN

## 2013-11-13 NOTE — Telephone Encounter (Signed)
Patient can stop taking medication over the weekend, please have Darliss Ridgel address on Monday   Dina Rich MD

## 2013-12-04 ENCOUNTER — Telehealth: Payer: Self-pay

## 2013-12-04 MED ORDER — PRAVASTATIN SODIUM 20 MG PO TABS: ORAL_TABLET | ORAL | Status: DC

## 2013-12-04 MED ORDER — PRAVASTATIN SODIUM 20 MG PO TABS: 20.0000 mg | ORAL_TABLET | Freq: Every evening | ORAL | Status: DC

## 2013-12-04 NOTE — Telephone Encounter (Signed)
Review by MD,pt not tolerating Crestor,start Pravastatin  Sent to [facility]

## 2013-12-04 NOTE — Telephone Encounter (Signed)
Clarification of dose of pravachol : take 1/2 tab (total 10 mg ) daily

## 2013-12-29 ENCOUNTER — Other Ambulatory Visit: Payer: Self-pay

## 2013-12-31 MED ORDER — PRAVASTATIN SODIUM 20 MG PO TABS: ORAL_TABLET | ORAL | Status: DC

## 2014-02-08 ENCOUNTER — Ambulatory Visit: Payer: Managed Care, Other (non HMO) | Admitting: Cardiovascular Disease

## 2014-02-12 ENCOUNTER — Ambulatory Visit: Payer: Managed Care, Other (non HMO) | Admitting: Adult Health

## 2014-03-04 ENCOUNTER — Encounter: Payer: Self-pay | Admitting: Adult Health

## 2014-03-04 ENCOUNTER — Ambulatory Visit (INDEPENDENT_AMBULATORY_CARE_PROVIDER_SITE_OTHER): Payer: Managed Care, Other (non HMO) | Admitting: Adult Health

## 2014-03-04 VITALS — BP 133/68 | HR 69 | Ht 67.0 in | Wt 139.0 lb

## 2014-03-04 DIAGNOSIS — E785 Hyperlipidemia, unspecified: Secondary | ICD-10-CM

## 2014-03-04 DIAGNOSIS — I2510 Atherosclerotic heart disease of native coronary artery without angina pectoris: Secondary | ICD-10-CM

## 2014-03-04 NOTE — Patient Instructions (Signed)
Your physician wants you to follow-up in: 6 months You will receive a reminder letter in the mail two months in advance. If you don't receive a letter, please call our office to schedule the follow-up appointment.     Your physician recommends that you continue on your current medications as directed. Please refer to the Current Medication list given to you today.      Thank you for choosing [facility] !        

## 2014-03-04 NOTE — Assessment & Plan Note (Signed)
New Rx for pravastatin along with form fill-out to give him 90 days. He will continue current medications.

## 2014-03-04 NOTE — Assessment & Plan Note (Signed)
He will continue to be on DAPT. He has had some issues with bleeding from his nose. He is advised to have saline spray for his nose to avoid nasal dryness.   He is given a Rx for pravastatin and form is filled out for Rx send off. He will continue on Effient. He was advised to quit smoking as this is a major risk factor for progressive CAD.

## 2014-03-04 NOTE — Progress Notes (Signed)
Name: Christian Lewis    DOB: [birth year]  Age: 58 y.o.  MR#: [id number]       PCP:  Fredirick Maudlin, MD      Insurance: Payor: CIG[employer] / Plan: CIGNA MANAGED / Product Type: *No Product type* /   CC:    Chief Complaint  Patient presents with  . Coronary Artery Disease  . Hyperlipidemia    VS Filed Vitals:   03/04/14 1545  BP: 133/68  Pulse: 69  Height: 5\' 7"  (1.702 m)  Weight: 139 lb (63.05 kg)    Weights Current Weight  03/04/14 139 lb (63.05 kg)  10/21/13 134 lb 12 oz (61.122 kg)  09/30/13 135 lb 12 oz (61.576 kg)    Blood Pressure  BP Readings from Last 3 Encounters:  03/04/14 133/68  10/21/13 105/61  09/30/13 96/55     Admit date:  (Not on file) Last encounter with RMR:  Visit date not found   Allergy Lipitor  Current Outpatient Prescriptions  Medication Sig Dispense Refill  . acetaminophen (TYLENOL) 500 MG tablet Take 1,000 mg by mouth as needed. For pain      . amLODipine (NORVASC) 2.5 MG tablet Take 1 tablet (2.5 mg total) by mouth daily.  30 tablet  6  . aspirin EC 81 MG EC tablet Take 1 tablet (81 mg total) by mouth daily.      . isosorbide mononitrate (IMDUR) 30 MG 24 hr tablet Take 1 tablet (30 mg total) by mouth daily.  30 tablet  6  . nitroGLYCERIN (NITROSTAT) 0.4 MG SL tablet Place 1 tablet (0.4 mg total) under the tongue every 5 (five) minutes x 3 doses as needed for chest pain.  25 tablet  3  . prasugrel (EFFIENT) 10 MG TABS tablet Take 1 tablet (10 mg total) by mouth daily.  90 tablet  3  . pravastatin (PRAVACHOL) 20 MG tablet Take 1/2 tablet (total 10 mg po daily )  90 tablet  3   No current facility-administered medications for this visit.    Discontinued Meds:    Medications Discontinued During This Encounter  Medication Reason  . atorvastatin (LIPITOR) 40 MG tablet Error    Patient Active Problem List   Diagnosis Date Noted  . Hyperlipidemia 10/21/2013  . CAD (coronary artery disease) 09/24/2013  . Coronary vasospasm 09/24/2013  .  Pulmonary nodule seen on imaging study 09/24/2013  . Tobacco abuse 09/23/2013  . Unstable angina 09/22/2013    LABS    Component Value Date/Time   NA 139 09/24/2013 0544   NA 138 09/23/2013 0546   NA 139 09/22/2013 1806   K 4.0 09/24/2013 0544   K 3.8 09/23/2013 0546   K 3.8 09/22/2013 1806   CL 104 09/24/2013 0544   CL 105 09/23/2013 0546   CL 102 09/22/2013 1806   CO2 26 09/24/2013 0544   CO2 28 09/23/2013 0546   CO2 29 09/22/2013 1806   GLUCOSE 100* 09/24/2013 0544   GLUCOSE 99 09/23/2013 0546   GLUCOSE 115* 09/22/2013 1806   BUN 17 09/24/2013 0544   BUN 20 09/23/2013 0546   BUN 18 09/22/2013 1806   CREATININE 0.76 09/24/2013 0544   CREATININE 0.70 09/23/2013 0546   CREATININE 0.82 09/22/2013 1806   CALCIUM 8.9 09/24/2013 0544   CALCIUM 8.7 09/23/2013 0546   CALCIUM 9.7 09/22/2013 1806   GFRNONAA >90 09/24/2013 0544   GFRNONAA >90 09/23/2013 0546   GFRNONAA >90 09/22/2013 1806   GFRAA >90 09/24/2013 0544   GFRAA >  90 09/23/2013 0546   GFRAA >90 09/22/2013 1806   CMP     Component Value Date/Time   NA 139 09/24/2013 0544   K 4.0 09/24/2013 0544   CL 104 09/24/2013 0544   CO2 26 09/24/2013 0544   GLUCOSE 100* 09/24/2013 0544   BUN 17 09/24/2013 0544   CREATININE 0.76 09/24/2013 0544   CALCIUM 8.9 09/24/2013 0544   GFRNONAA >90 09/24/2013 0544   GFRAA >90 09/24/2013 0544       Component Value Date/Time   WBC 9.4 09/24/2013 0544   WBC 8.7 09/23/2013 0546   WBC 7.6 09/22/2013 1806   HGB 14.5 09/24/2013 0544   HGB 14.2 09/23/2013 0546   HGB 14.7 09/22/2013 1806   HCT 41.9 09/24/2013 0544   HCT 40.6 09/23/2013 0546   HCT 42.7 09/22/2013 1806   MCV 95.9 09/24/2013 0544   MCV 95.1 09/23/2013 0546   MCV 95.7 09/22/2013 1806    Lipid Panel     Component Value Date/Time   CHOL 118 09/23/2013 0546   TRIG 81 09/23/2013 0546   HDL 37* 09/23/2013 0546   CHOLHDL 3.2 09/23/2013 0546   VLDL 16 09/23/2013 0546   LDLCALC 65 09/23/2013 0546    ABG No results found for this basename: phart, pco2, pco2art, po2,  po2art, hco3, tco2, acidbasedef, o2sat     Lab Results  Component Value Date   TSH 7.790* 09/22/2013   BNP (last 3 results)  Recent Labs  09/22/13 2330  PROBNP 9.7   Cardiac Panel (last 3 results) No results found for this basename: CKTOTAL, CKMB, TROPONINI, RELINDX,  in the last 72 hours  Iron/TIBC/Ferritin No results found for this basename: iron, tibc, ferritin     EKG Orders placed in visit on 09/30/13  . EKG 12-LEAD     Prior Assessment and Plan Problem List as of 03/04/2014     Cardiovascular and Mediastinum   Unstable angina   CAD (coronary artery disease)   Coronary vasospasm   Last Assessment & Plan   09/30/2013 Office Visit Edited 09/30/2013  1:46 PM by Dyann KiefMichele M Lenze, PA-C     He was found to have severe proximal RCA spasm superimposed on a moderate lesion which was significant by FFR interrogation. He was treated with bare-metal stent to the proximal RCA. He had normal LV function and LV and diastolic pressure. Patient is on Imdur, Effient and Norvasc. Beta blockers and decongestants are to be avoided. He has had no further chest pain. Continue current treatment. Will give him a note to return to work. We'll check fasting lipid panel and LFT's  in 4 weeks      Other   Tobacco abuse   Last Assessment & Plan   09/30/2013 Office Visit Written 09/30/2013  1:45 PM by Dyann KiefMichele M Lenze, PA-C      Smoking cessation discussed    Pulmonary nodule seen on imaging study   Last Assessment & Plan   09/30/2013 Office Visit Written 09/30/2013  1:45 PM by Dyann KiefMichele M Lenze, PA-C     Followup CT with contrast in 6 months    Hyperlipidemia       Imaging: No results found.

## 2014-03-04 NOTE — Progress Notes (Signed)
HPI: [name] is a 58 year old patient of [name] we are following for ongoing assessment and management of unstable angina, RCA spasm, superimposed on a moderate lesion which [facility]. He was treated with a bare-metal stent to the proximal RCA. He had normal LV function. His last seen in the office in October of 2014 was brought recurrent chest pain palpitation or dyspnea. He unfortunately continued to smoke was cutting down. Is complaining of myalgias on Lipitor, and was changed to Crestor 5 mg daily.  Of note, on the last office visit it was noted by [name] that he had a 4 mm left lower lobe parenchymal nodule and a 6 mm round nodule at the left base. He was recommended for followup CT scans in 6-12 months.  He comes today for medication refills. He is not having any angina symptoms. He is medically compliant. He needs forms filled out for send off medications.   Allergies  Allergen Reactions  . Lipitor [Atorvastatin] Other (See Comments)    myalgia    Current Outpatient Prescriptions  Medication Sig Dispense Refill  . acetaminophen (TYLENOL) 500 MG tablet Take 1,000 mg by mouth as needed. For pain      . amLODipine (NORVASC) 2.5 MG tablet Take 1 tablet (2.5 mg total) by mouth daily.  30 tablet  6  . aspirin EC 81 MG EC tablet Take 1 tablet (81 mg total) by mouth daily.      . isosorbide mononitrate (IMDUR) 30 MG 24 hr tablet Take 1 tablet (30 mg total) by mouth daily.  30 tablet  6  . nitroGLYCERIN (NITROSTAT) 0.4 MG SL tablet Place 1 tablet (0.4 mg total) under the tongue every 5 (five) minutes x 3 doses as needed for chest pain.  25 tablet  3  . prasugrel (EFFIENT) 10 MG TABS tablet Take 1 tablet (10 mg total) by mouth daily.  90 tablet  3  . pravastatin (PRAVACHOL) 20 MG tablet Take 1/2 tablet (total 10 mg po daily )  90 tablet  3   No current facility-administered medications for this visit.    Past  Medical History  Diagnosis Date  . CAD (coronary artery disease)     a. 09/2013 Cath/PCI: LM nl, LAD min irregs, D1/2/3 small, min irregs, LCX min irregs, OM1/2/3 nl, RI nl, RCA 50-60p w severe spasm on FFR (0.77->0.5)-->3.5x18 Vision BMS, EF 60%.  . Coronary vasospasm   . Lung nodule     a. 09/2013 CT chest with contrast: 4mm and 6mm LLL nodules **Needs f/u CT in 6-12 mos.  . Tobacco abuse   . Collagen vascular disease   . Hypertension     No past surgical history on file.  LKG:[id number]:Review of systems complete and found to be negative unless listed above  PHYSICAL EXAM BP 133/68  Pulse 69  Ht 5\' 7"  (1.702 m)  Wt 139 lb (63.05 kg)  BMI 21.77 kg/m2  General: Well developed, well nourished, in no acute distress Head: Eyes PERRLA, No xanthomas.   Normal cephalic and atramatic  Lungs: Clear bilaterally mild crackles in the bases. Heart: HRRR S1 S2, without MRG.  Pulses are 2+ & equal.            No carotid bruit. No JVD.  No abdominal bruits. No femoral bruits. Abdomen: Bowel sounds are positive, abdomen soft and non-tender without masses or  Hernia's noted. Msk:  Back normal, normal gait. Normal strength and tone for age. Extremities: No clubbing, cyanosis or edema.  DP +1 Neuro: Alert and oriented X 3. Psych:  Good affect, responds appropriately    ASSESSMENT AND PLAN

## 2014-05-05 ENCOUNTER — Telehealth: Payer: Self-pay | Admitting: *Deleted

## 2014-05-05 MED ORDER — PRAVASTATIN SODIUM 20 MG PO TABS: ORAL_TABLET | ORAL | Status: DC

## 2014-05-05 MED ORDER — AMLODIPINE BESYLATE 2.5 MG PO TABS: 2.5000 mg | ORAL_TABLET | Freq: Every day | ORAL | Status: DC

## 2014-05-05 NOTE — Telephone Encounter (Signed)
PT NEEDS HIS BLOOD PRESSURE MEDICATIONS HE THINKS IT IS ISOSORBIDE AND [facility]IPINE. [facility]

## 2014-05-05 NOTE — Telephone Encounter (Signed)
Medication sent via escribe.  

## 2014-05-07 ENCOUNTER — Other Ambulatory Visit: Payer: Self-pay

## 2014-05-07 MED ORDER — ISOSORBIDE MONONITRATE ER 30 MG PO TB24: 30.0000 mg | ORAL_TABLET | Freq: Every day | ORAL | Status: DC

## 2014-07-12 ENCOUNTER — Telehealth: Payer: Self-pay | Admitting: Adult Health

## 2014-07-12 ENCOUNTER — Other Ambulatory Visit: Payer: Self-pay

## 2014-07-12 MED ORDER — AMLODIPINE BESYLATE 2.5 MG PO TABS: 2.5000 mg | ORAL_TABLET | Freq: Every day | ORAL | Status: DC

## 2014-07-12 MED ORDER — ISOSORBIDE MONONITRATE ER 30 MG PO TB24: 30.0000 mg | ORAL_TABLET | Freq: Every day | ORAL | Status: DC

## 2014-07-12 NOTE — Telephone Encounter (Signed)
Refill request complete to [facility]

## 2014-07-12 NOTE — Progress Notes (Signed)
Refill to [facility] complete

## 2014-07-12 NOTE — Telephone Encounter (Signed)
Please see refill bin / tgs  °

## 2014-08-27 ENCOUNTER — Encounter: Payer: Self-pay | Admitting: Adult Health

## 2014-08-27 NOTE — Progress Notes (Signed)
ERROR. Cancelled appt 

## 2014-08-31 ENCOUNTER — Encounter: Payer: Managed Care, Other (non HMO) | Admitting: Adult Health

## 2014-09-02 ENCOUNTER — Encounter: Payer: Self-pay | Admitting: Adult Health

## 2014-09-02 ENCOUNTER — Ambulatory Visit (INDEPENDENT_AMBULATORY_CARE_PROVIDER_SITE_OTHER): Payer: Managed Care, Other (non HMO) | Admitting: Adult Health

## 2014-09-02 VITALS — BP 120/70 | HR 65 | Ht 68.0 in | Wt 135.0 lb

## 2014-09-02 DIAGNOSIS — I201 Angina pectoris with documented spasm: Secondary | ICD-10-CM

## 2014-09-02 DIAGNOSIS — Z720 Tobacco use: Secondary | ICD-10-CM

## 2014-09-02 DIAGNOSIS — F17200 Nicotine dependence, unspecified, uncomplicated: Secondary | ICD-10-CM

## 2014-09-02 DIAGNOSIS — E785 Hyperlipidemia, unspecified: Secondary | ICD-10-CM

## 2014-09-02 DIAGNOSIS — I2510 Atherosclerotic heart disease of native coronary artery without angina pectoris: Secondary | ICD-10-CM

## 2014-09-02 MED ORDER — AMLODIPINE BESYLATE 2.5 MG PO TABS: 2.5000 mg | ORAL_TABLET | Freq: Every day | ORAL | Status: DC

## 2014-09-02 MED ORDER — PRASUGREL HCL 10 MG PO TABS
10.0000 mg | ORAL_TABLET | Freq: Every day | ORAL | Status: DC
Start: 2014-09-02 — End: 2015-09-02

## 2014-09-02 MED ORDER — ISOSORBIDE MONONITRATE ER 30 MG PO TB24: 30.0000 mg | ORAL_TABLET | Freq: Every day | ORAL | Status: DC

## 2014-09-02 MED ORDER — PRAVASTATIN SODIUM 20 MG PO TABS: ORAL_TABLET | ORAL | Status: DC

## 2014-09-02 NOTE — Assessment & Plan Note (Signed)
No recurrent chest pain or spasming on nitrates. Continue current medication regimen.

## 2014-09-02 NOTE — Assessment & Plan Note (Signed)
Fasting lipids LFTs and BMET are ordered. He will continue on pravastatin as directed. Refills were provided on all cardiac meds.

## 2014-09-02 NOTE — Progress Notes (Deleted)
Name: Christian Lewis    DOB: 08-30-1956  Age: 58 y.o.  MR#: [id number]       PCP:  Fredirick Maudlin, MD      Insurance: Payor: CIGNA / Plan: CIGNA MANAGED / Product Type: *No Product type* /   CC:    Chief Complaint  Patient presents with  . Chest Pain  . Coronary Artery Disease    VS Filed Vitals:   09/02/14 1529  BP: 120/70  Pulse: 65  Height:  (1.727 m)  Weight: 135 lb (61.236 kg)  SpO2: 95%    Weights Current Weight  09/02/14 135 lb (61.236 kg)  03/04/14 139 lb (63.05 kg)  10/21/13 134 lb 12 oz (61.122 kg)    Blood Pressure  BP Readings from Last 3 Encounters:  09/02/14 120/70  03/04/14 133/68  10/21/13 105/61     Admit date:  (Not on file) Last encounter with RMR:  07/12/2014   Allergy Lipitor  Current Outpatient Prescriptions  Medication Sig Dispense Refill  . acetaminophen (TYLENOL) 500 MG tablet Take 1,000 mg by mouth as needed. For pain      . amLODipine (NORVASC) 2.5 MG tablet Take 1 tablet (2.5 mg total) by mouth daily.  90 tablet  3  . aspirin EC 81 MG EC tablet Take 1 tablet (81 mg total) by mouth daily.      . isosorbide mononitrate (IMDUR) 30 MG 24 hr tablet Take 1 tablet (30 mg total) by mouth daily.  90 tablet  3  . nitroGLYCERIN (NITROSTAT) 0.4 MG SL tablet Place 1 tablet (0.4 mg total) under the tongue every 5 (five) minutes x 3 doses as needed for chest pain.  25 tablet  3  . prasugrel (EFFIENT) 10 MG TABS tablet Take 1 tablet (10 mg total) by mouth daily.  90 tablet  3  . pravastatin (PRAVACHOL) 20 MG tablet Take 1/2 tablet (total 10 mg po daily )  90 tablet  1   No current facility-administered medications for this visit.    Discontinued Meds:   There are no discontinued medications.  Patient Active Problem List   Diagnosis Date Noted  . Hyperlipidemia 10/21/2013  . CAD (coronary artery disease) 09/24/2013  . Coronary vasospasm 09/24/2013  . Pulmonary nodule seen on imaging study 09/24/2013  . Tobacco abuse 09/23/2013  . Unstable  angina 09/22/2013    LABS    Component Value Date/Time   NA 139 09/24/2013 0544   NA 138 09/23/2013 0546   NA 139 09/22/2013 1806   K 4.0 09/24/2013 0544   K 3.8 09/23/2013 0546   K 3.8 09/22/2013 1806   CL 104 09/24/2013 0544   CL 105 09/23/2013 0546   CL 102 09/22/2013 1806   CO2 26 09/24/2013 0544   CO2 28 09/23/2013 0546   CO2 29 09/22/2013 1806   GLUCOSE 100* 09/24/2013 0544   GLUCOSE 99 09/23/2013 0546   GLUCOSE 115* 09/22/2013 1806   BUN 17 09/24/2013 0544   BUN 20 09/23/2013 0546   BUN 18 09/22/2013 1806   CREATININE 0.76 09/24/2013 0544   CREATININE 0.70 09/23/2013 0546   CREATININE 0.82 09/22/2013 1806   CALCIUM 8.9 09/24/2013 0544   CALCIUM 8.7 09/23/2013 0546   CALCIUM 9.7 09/22/2013 1806   GFRNONAA >90 09/24/2013 0544   GFRNONAA >90 09/23/2013 0546   GFRNONAA >90 09/22/2013 1806   GFRAA >90 09/24/2013 0544   GFRAA >90 09/23/2013 0546   GFRAA >90 09/22/2013 1806   CMP  Component Value Date/Time   NA 139 09/24/2013 0544   K 4.0 09/24/2013 0544   CL 104 09/24/2013 0544   CO2 26 09/24/2013 0544   GLUCOSE 100* 09/24/2013 0544   BUN 17 09/24/2013 0544   CREATININE 0.76 09/24/2013 0544   CALCIUM 8.9 09/24/2013 0544   GFRNONAA >90 09/24/2013 0544   GFRAA >90 09/24/2013 0544       Component Value Date/Time   WBC 9.4 09/24/2013 0544   WBC 8.7 09/23/2013 0546   WBC 7.6 09/22/2013 1806   HGB 14.5 09/24/2013 0544   HGB 14.2 09/23/2013 0546   HGB 14.7 09/22/2013 1806   HCT 41.9 09/24/2013 0544   HCT 40.6 09/23/2013 0546   HCT 42.7 09/22/2013 1806   MCV 95.9 09/24/2013 0544   MCV 95.1 09/23/2013 0546   MCV 95.7 09/22/2013 1806    Lipid Panel     Component Value Date/Time   CHOL 118 09/23/2013 0546   TRIG 81 09/23/2013 0546   HDL 37* 09/23/2013 0546   CHOLHDL 3.2 09/23/2013 0546   VLDL 16 09/23/2013 0546   LDLCALC 65 09/23/2013 0546    ABG No results found for this basename: phart, pco2, pco2art, po2, po2art, hco3, tco2, acidbasedef, o2sat     Lab Results  Component Value Date   TSH 7.790*  09/22/2013   BNP (last 3 results)  Recent Labs  09/22/13 2330  PROBNP 9.7   Cardiac Panel (last 3 results) No results found for this basename: CKTOTAL, CKMB, TROPONINI, RELINDX,  in the last 72 hours  Iron/TIBC/Ferritin/ %Sat No results found for this basename: iron, tibc, ferritin, ironpctsat     EKG Orders placed in visit on 09/02/14  . EKG 12-LEAD     Prior Assessment and Plan Problem List as of 09/02/2014     Cardiovascular and Mediastinum   Unstable angina   CAD (coronary artery disease)   Last Assessment & Plan   03/04/2014 Office Visit Written 03/04/2014  4:23 PM by Jodelle Gross, NP     He will continue to be on DAPT. He has had some issues with bleeding from his nose. He is advised to have saline spray for his nose to avoid nasal dryness.   He is given a Rx for pravastatin and form is filled out for Rx send off. He will continue on Effient. He was advised to quit smoking as this is a major risk factor for progressive CAD.    Coronary vasospasm   Last Assessment & Plan   09/30/2013 Office Visit Edited 09/30/2013  1:46 PM by Dyann Kief, PA-C     He was found to have severe proximal RCA spasm superimposed on a moderate lesion which was significant by FFR interrogation. He was treated with bare-metal stent to the proximal RCA. He had normal LV function and LV and diastolic pressure. Patient is on Imdur, Effient and Norvasc. Beta blockers and decongestants are to be avoided. He has had no further chest pain. Continue current treatment. Will give him a note to return to work. We'll check fasting lipid panel and LFT's  in 4 weeks      Other   Tobacco abuse   Last Assessment & Plan   09/30/2013 Office Visit Written 09/30/2013  1:45 PM by Dyann Kief, PA-C      Smoking cessation discussed    Pulmonary nodule seen on imaging study   Last Assessment & Plan   09/30/2013 Office Visit Written 09/30/2013  1:45 PM by Tarri Abernethy  Lenze, PA-C     Followup CT with contrast in 6  months    Hyperlipidemia   Last Assessment & Plan   03/04/2014 Office Visit Written 03/04/2014  4:24 PM by Jodelle Gross, NP     New Rx for pravastatin along with form fill-out to give him 90 days. He will continue current medications.        Imaging: No results found.

## 2014-09-02 NOTE — Patient Instructions (Addendum)
Your physician wants you to follow-up in: 1 year You will receive a reminder letter in the mail two months in advance. If you don't receive a letter, please call our office to schedule the follow-up appointment.    Your physician recommends that you continue on your current medications as directed. Please refer to the Current Medication list given to you today.  I have given you refills for 1 year duration on your medications  Please get lab work (CBC,BMET,LIPID,LFT'S)  Thank you for choosing [facility] !

## 2014-09-02 NOTE — Assessment & Plan Note (Signed)
He is advised on smoking cessation. He is not yet completely quit.

## 2014-09-02 NOTE — Assessment & Plan Note (Signed)
He is doing well and continues on dual antiplatelet therapy. He would like to stop the Effient if possible. . I would like to keep him on it until next office visit as his drug-eluting stent is at the proximal right coronary artery, with RCA spasms. He has not had any recurrent chest pain. It is tolerating the nitrates without evidence of dizziness or headache. I next office visit, we will take off of Effient as he will be retiring in October of 2016 and will be on a limited income.

## 2014-09-02 NOTE — Progress Notes (Signed)
HPI: Mr. Mcglasson  is a 58 year old patient Dr. Purvis Sheffield we are following for ongoing assist in management of unstable angina, RCA spasm, superimposed on moderate lesion, which was found to be significant by [facility] interrogation during recent hospitalization in October 2014.   The patient was treated with a bare-metal stent to the proximal RCA. He was last seen in the office in March of 2015 and was found to be stable without any recurrent angina symptoms. He was continued on nitrates, Effient, and amlodipine.  He comes today without any cardiac complaints with the exception of some bruising which has been recurring with use of dual antiplatelet therapy. The patient has had no hospitalizations, new diagnoses, ER visits, or surgeries. He is medically compliant. He is on him for going on a cruise in a few weeks. He denies any chest pain, shortness of breath, or fatigue.    Allergies  Allergen Reactions  . Lipitor [Atorvastatin] Other (See Comments)    myalgia    Current Outpatient Prescriptions  Medication Sig Dispense Refill  . acetaminophen (TYLENOL) 500 MG tablet Take 1,000 mg by mouth as needed. For pain      . amLODipine (NORVASC) 2.5 MG tablet Take 1 tablet (2.5 mg total) by mouth daily.  90 tablet  3  . aspirin EC 81 MG EC tablet Take 1 tablet (81 mg total) by mouth daily.      . isosorbide mononitrate (IMDUR) 30 MG 24 hr tablet Take 1 tablet (30 mg total) by mouth daily.  90 tablet  3  . nitroGLYCERIN (NITROSTAT) 0.4 MG SL tablet Place 1 tablet (0.4 mg total) under the tongue every 5 (five) minutes x 3 doses as needed for chest pain.  25 tablet  3  . prasugrel (EFFIENT) 10 MG TABS tablet Take 1 tablet (10 mg total) by mouth daily.  90 tablet  3  . pravastatin (PRAVACHOL) 20 MG tablet Take 1/2 tablet (total 10 mg po daily )  90 tablet  1   No current facility-administered medications for this visit.    Past Medical History  Diagnosis Date  . CAD (coronary artery disease)     a.  09/2013 Cath/PCI: LM nl, LAD min irregs, D1/2/3 small, min irregs, LCX min irregs, OM1/2/3 nl, RI nl, RCA 50-60p w severe spasm on FFR (0.77->0.5)-->3.5x18 Vision BMS, EF 60%.  . Coronary vasospasm   . Lung nodule     a. 09/2013 CT chest with contrast: 4mm and 6mm LLL nodules **Needs f/u CT in 6-12 mos.  . Tobacco abuse   . Collagen vascular disease   . Hypertension     History reviewed. No pertinent past surgical history.  ROS: Review of systems complete and found to be negative unless listed above  PHYSICAL EXAM BP 120/70  Pulse 65  Ht  (1.727 m)  Wt 135 lb (61.236 kg)  BMI 20.53 kg/m2  SpO2 95% General: Well developed, well nourished, in no acute distress Head: Eyes PERRLA, No xanthomas.   Normal cephalic and atramatic  Lungs: Clear bilaterally to auscultation and percussion. Heart: HRRR S1 S2, without MRG.  Pulses are 2+ & equal.            No carotid bruit. No JVD.  No abdominal bruits. No femoral bruits. Abdomen: Bowel sounds are positive, abdomen soft and non-tender without masses or                  Hernia's noted. Msk:  Back normal, normal gait. Normal  strength and tone for age. Extremities: No clubbing, cyanosis or edema.  DP +1 Neuro: Alert and oriented X 3. Psych:  Good affect, responds appropriately    EKG: NSR with Septal infarct. Rate of 64 bpm.  ASSESSMENT AND PLAN

## 2014-09-04 LAB — CBC
HEMATOCRIT: 45.9 % (ref 39.0–52.0)
HEMOGLOBIN: 16 g/dL (ref 13.0–17.0)
MCH: 32.9 pg (ref 26.0–34.0)
MCHC: 34.9 g/dL (ref 30.0–36.0)
MCV: 94.3 fL (ref 78.0–100.0)
PLATELETS: 185 10*3/uL (ref 150–400)
RBC: 4.87 MIL/uL (ref 4.22–5.81)
RDW: 13.8 % (ref 11.5–15.5)
WBC: 6.5 10*3/uL (ref 4.0–10.5)

## 2014-09-04 LAB — HEPATIC FUNCTION PANEL
ALBUMIN: 4.1 g/dL (ref 3.5–5.2)
ALT: 13 U/L (ref 0–53)
AST: 13 U/L (ref 0–37)
Alkaline Phosphatase: 53 U/L (ref 39–117)
Bilirubin, Direct: 0.1 mg/dL (ref 0.0–0.3)
Indirect Bilirubin: 0.4 mg/dL (ref 0.2–1.2)
TOTAL PROTEIN: 6.8 g/dL (ref 6.0–8.3)
Total Bilirubin: 0.5 mg/dL (ref 0.2–1.2)

## 2014-09-04 LAB — BASIC METABOLIC PANEL
BUN: 22 mg/dL (ref 6–23)
CALCIUM: 9.6 mg/dL (ref 8.4–10.5)
CO2: 28 meq/L (ref 19–32)
CREATININE: 0.8 mg/dL (ref 0.50–1.35)
Chloride: 105 mEq/L (ref 96–112)
Glucose, Bld: 87 mg/dL (ref 70–99)
Potassium: 4.6 mEq/L (ref 3.5–5.3)
SODIUM: 138 meq/L (ref 135–145)

## 2014-09-04 LAB — LIPID PANEL
CHOLESTEROL: 150 mg/dL (ref 0–200)
HDL: 45 mg/dL (ref >39)
LDL Cholesterol: 89 mg/dL (ref 0–99)
Total CHOL/HDL Ratio: 3.3 Ratio
Triglycerides: 79 mg/dL (ref <150)
VLDL: 16 mg/dL (ref 0–40)

## 2014-12-02 ENCOUNTER — Encounter (HOSPITAL_COMMUNITY): Payer: Self-pay | Admitting: Cardiovascular Disease

## 2015-06-19 IMAGING — CT CT CHEST W/ CM
2 of 4 series · 15 of 36 positions shown, 18 images · IV contrast (CONTRAST)
Comparison: 09/23/2013

CLINICAL DATA: Evaluate for pulmonary nodule

EXAM:
CT CHEST WITH CONTRAST
TECHNIQUE: Multidetector CT imaging of the chest was performed during
intravenous contrast administration.
CONTRAST:  80mL OMNIPAQUE IOHEXOL 300 MG/ML  SOLN

[Series 2: chest with · axial · 0.68mm/px · z∈[-382,-77]mm · 12 of 73 slices shown, 15 images]
[im 6/73  mediastinal]
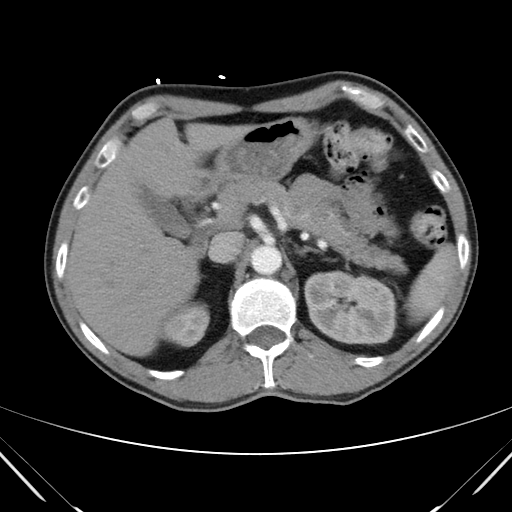
[im 6/73  lung]
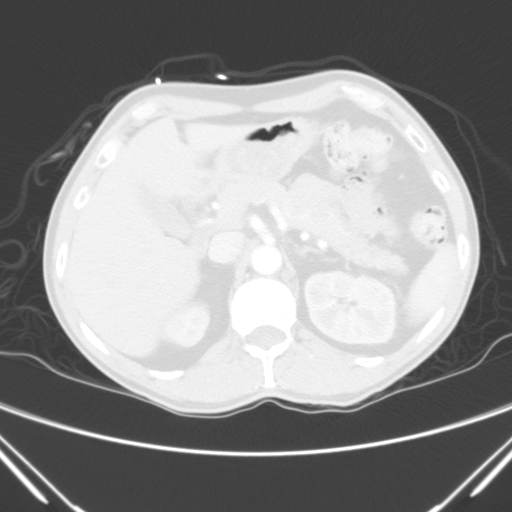
[im 12/73  lung]
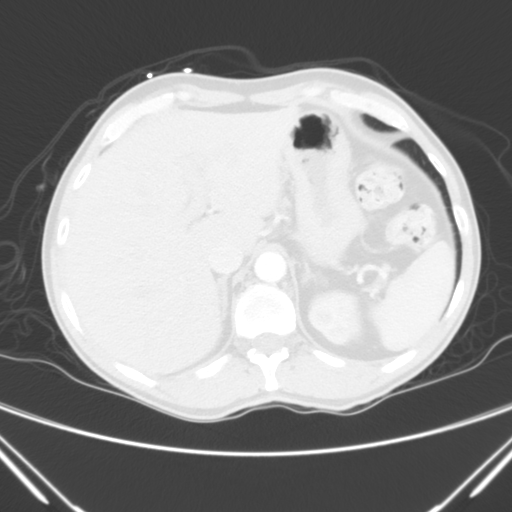
[im 17/73  lung]
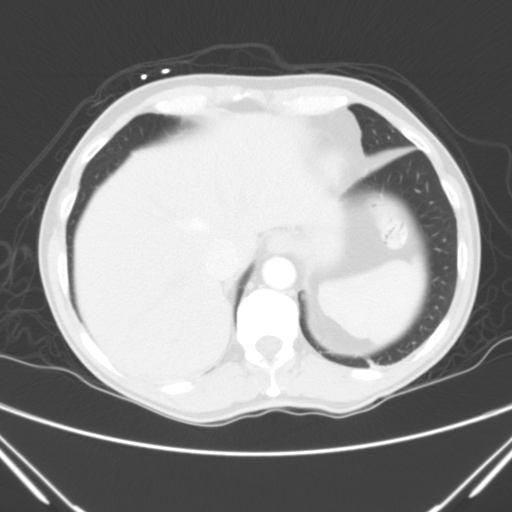
[im 23/73  lung]
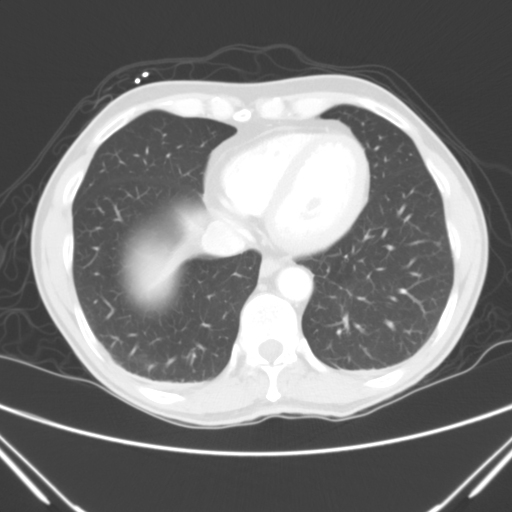
[im 28/73  mediastinal]
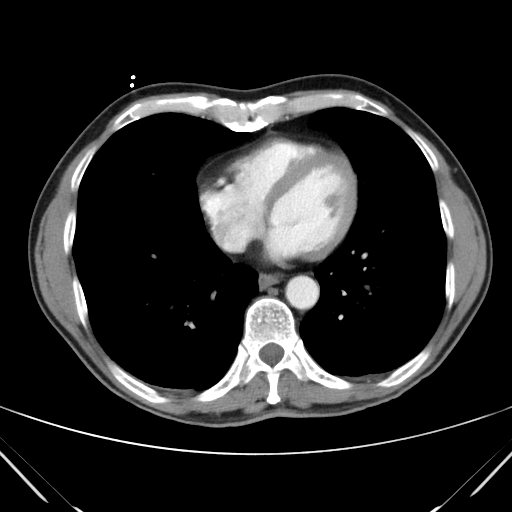
[im 28/73  lung]
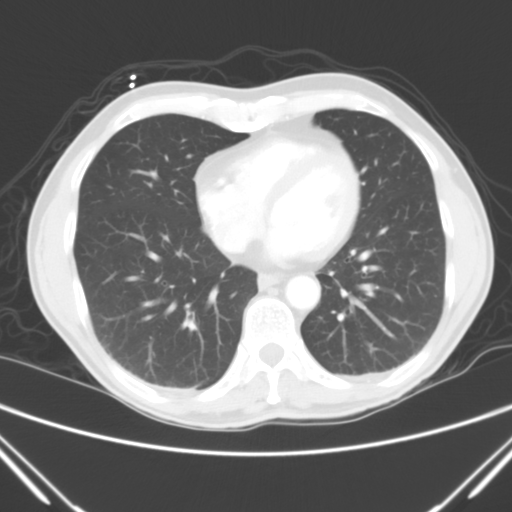
[im 34/73  lung]
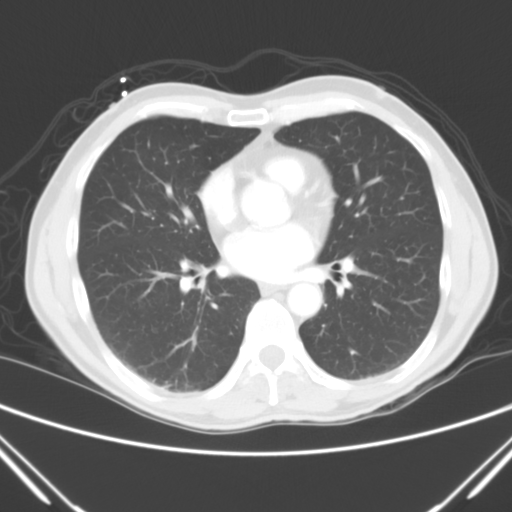
[im 39/73  lung]
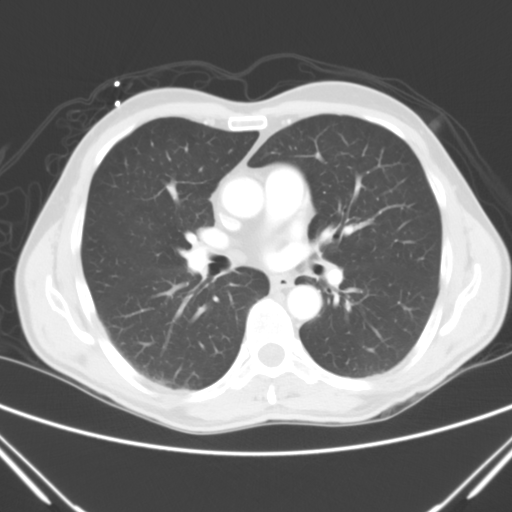
[im 45/73  lung]
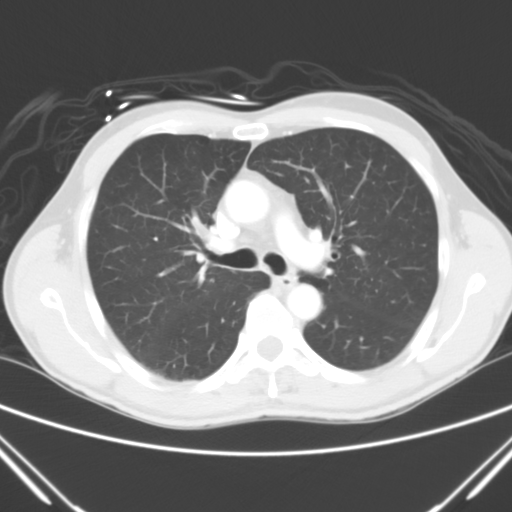
[im 50/73  mediastinal]
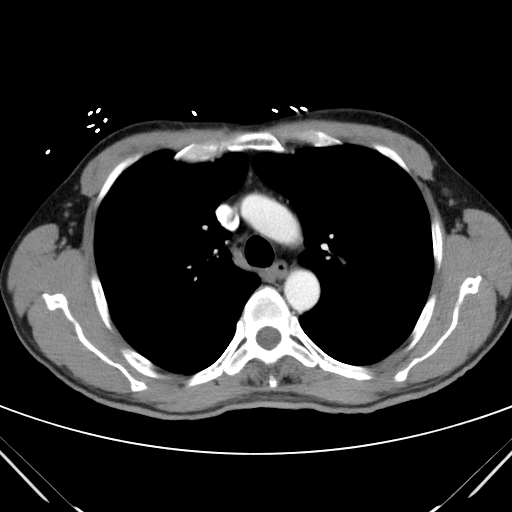
[im 50/73  lung]
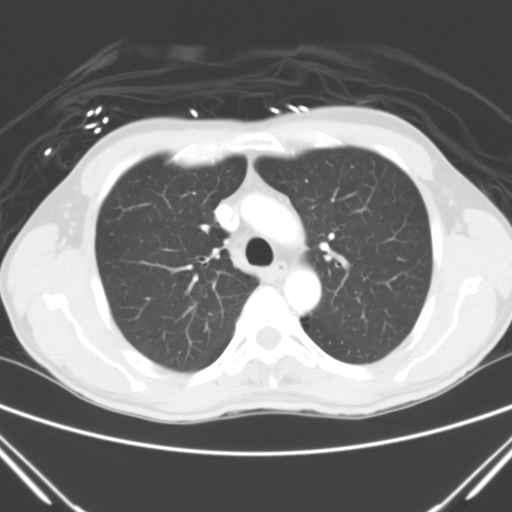
[im 56/73  lung]
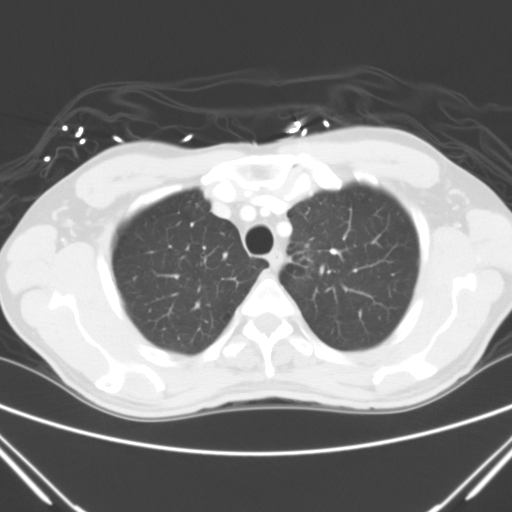
[im 61/73  lung]
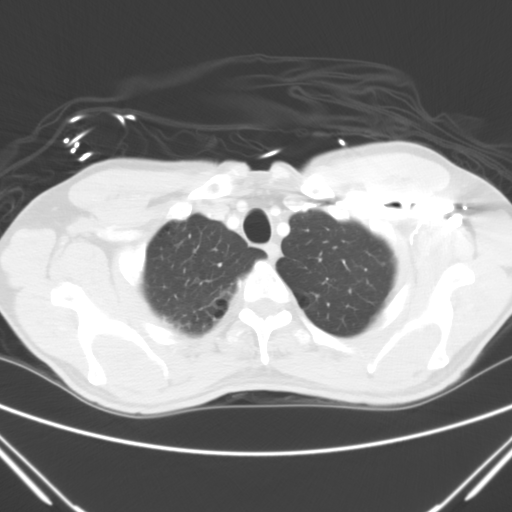
[im 67/73  lung]
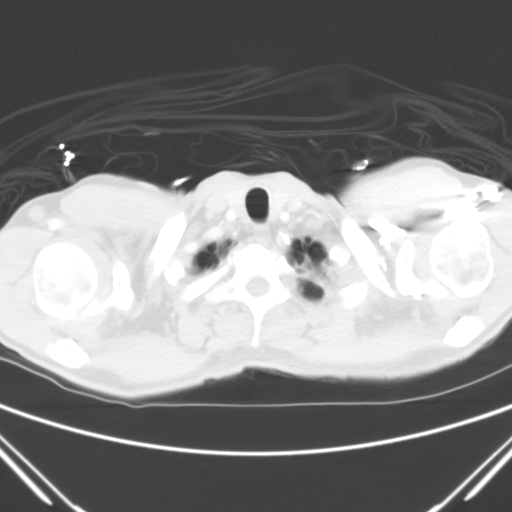

[cor · coronal · 0.70mm/px · 3 of 108 slices shown]
[im 22/108  lung]
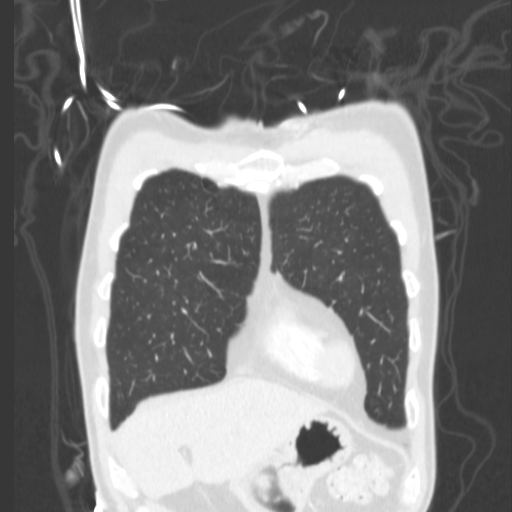
[im 43/108  lung]
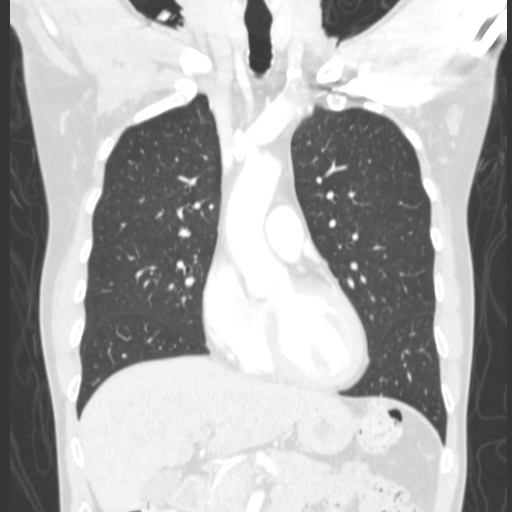
[im 65/108  lung]
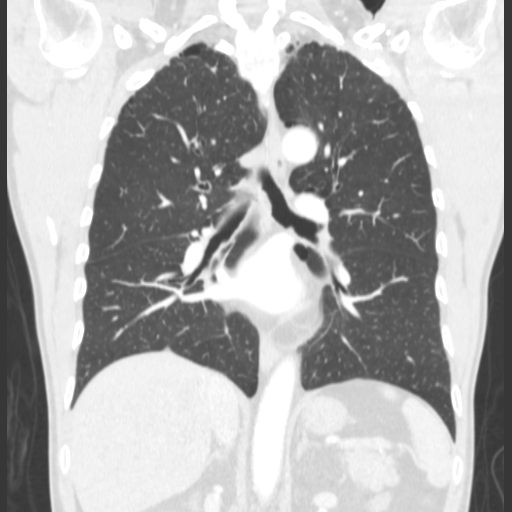

[15 of 36 positions shown; findings below may reference images not displayed]

FINDINGS: Mild changes of paraseptal emphysema noted. Scar versus platelike
atelectasis noted in the left base. In the left lower lobe there is
a parenchymal nodule measuring 4 mm, image 50/series 3. In the left
base there is a 6 mm nodule, image 59/series 3.

The trachea appears patent and is midline. The heart size is normal.
No pericardial effusion identified. No enlarged axillary or
supraclavicular lymph nodes.

Limited imaging through the upper abdomen shows no acute findings.

Review of the visualized osseous structures is unremarkable. There
is no aggressive lytic or sclerotic bone lesions identified.
IMPRESSION: 1. No acute findings identified.

2. Small nonspecific pulmonary nodules are noted in the left lower
lobe. If the patient is at high risk for bronchogenic carcinoma,
follow-up chest CT at 6-12 months is recommended. If the patient is
at low risk for bronchogenic carcinoma, follow-up chest CT at 12
months is recommended. This recommendation follows the consensus
statement: Guidelines for Management of Small Pulmonary Nodules
Detected on CT Scans: A Statement from the [HOSPITAL] as

## 2015-09-02 ENCOUNTER — Ambulatory Visit (INDEPENDENT_AMBULATORY_CARE_PROVIDER_SITE_OTHER): Payer: Managed Care, Other (non HMO) | Admitting: Adult Health

## 2015-09-02 ENCOUNTER — Encounter: Payer: Self-pay | Admitting: Adult Health

## 2015-09-02 VITALS — BP 186/90 | HR 66 | Ht 68.0 in | Wt 137.0 lb

## 2015-09-02 DIAGNOSIS — Z720 Tobacco use: Secondary | ICD-10-CM

## 2015-09-02 DIAGNOSIS — I2510 Atherosclerotic heart disease of native coronary artery without angina pectoris: Secondary | ICD-10-CM | POA: Diagnosis not present

## 2015-09-02 DIAGNOSIS — I10 Essential (primary) hypertension: Secondary | ICD-10-CM

## 2015-09-02 DIAGNOSIS — E785 Hyperlipidemia, unspecified: Secondary | ICD-10-CM

## 2015-09-02 DIAGNOSIS — Z79899 Other long term (current) drug therapy: Secondary | ICD-10-CM

## 2015-09-02 MED ORDER — PRAVASTATIN SODIUM 20 MG PO TABS: ORAL_TABLET | ORAL | Status: DC

## 2015-09-02 MED ORDER — NITROGLYCERIN 0.4 MG SL SUBL: 0.4000 mg | SUBLINGUAL_TABLET | SUBLINGUAL | Status: DC | PRN

## 2015-09-02 MED ORDER — AMLODIPINE BESYLATE 2.5 MG PO TABS: 2.5000 mg | ORAL_TABLET | Freq: Every day | ORAL | Status: DC

## 2015-09-02 MED ORDER — ISOSORBIDE MONONITRATE ER 30 MG PO TB24: 30.0000 mg | ORAL_TABLET | Freq: Every day | ORAL | Status: DC

## 2015-09-02 MED ORDER — PRASUGREL HCL 10 MG PO TABS: 10.0000 mg | ORAL_TABLET | Freq: Every day | ORAL | Status: DC

## 2015-09-02 MED ORDER — NITROGLYCERIN 0.4 MG SL SUBL: 0.4000 mg | SUBLINGUAL_TABLET | SUBLINGUAL | Status: AC | PRN

## 2015-09-02 NOTE — Progress Notes (Deleted)
Name: Christian Lewis    DOB: 13-Nov-1956  Age: 59 y.o.  MR#: [id number]       PCP:  Fredirick Maudlin, MD      Insurance: Payor: CIGNA / Plan: CIGNA MANAGED / Product Type: *No Product type* /   CC:    Chief Complaint  Patient presents with  . Coronary Artery Disease    VS Filed Vitals:   09/02/15 1542  BP: 186/90  Pulse: 66  Height: 5\' 8"  (1.727 m)  Weight: 137 lb (62.143 kg)    Weights Current Weight  09/02/15 137 lb (62.143 kg)  09/02/14 135 lb (61.236 kg)  03/04/14 139 lb (63.05 kg)    Blood Pressure  BP Readings from Last 3 Encounters:  09/02/15 186/90  09/02/14 120/70  03/04/14 133/68     Admit date:  (Not on file) Last encounter with RMR:  Visit date not found   Allergy Lipitor  Current Outpatient Prescriptions  Medication Sig Dispense Refill  . acetaminophen (TYLENOL) 500 MG tablet Take 1,000 mg by mouth as needed. For pain    . amLODipine (NORVASC) 2.5 MG tablet Take 1 tablet (2.5 mg total) by mouth daily. 90 tablet 3  . aspirin EC 81 MG EC tablet Take 1 tablet (81 mg total) by mouth daily.    . isosorbide mononitrate (IMDUR) 30 MG 24 hr tablet Take 1 tablet (30 mg total) by mouth daily. 90 tablet 3  . nitroGLYCERIN (NITROSTAT) 0.4 MG SL tablet Place 1 tablet (0.4 mg total) under the tongue every 5 (five) minutes x 3 doses as needed for chest pain. 25 tablet 3  . prasugrel (EFFIENT) 10 MG TABS tablet Take 1 tablet (10 mg total) by mouth daily. 90 tablet 3  . pravastatin (PRAVACHOL) 20 MG tablet Take 1/2 tablet (total 10 mg po daily ) 90 tablet 1  . levothyroxine (SYNTHROID, LEVOTHROID) 25 MCG tablet Take 25 mcg by mouth daily before breakfast.      No current facility-administered medications for this visit.    Discontinued Meds:   There are no discontinued medications.  Patient Active Problem List   Diagnosis Date Noted  . Hyperlipidemia 10/21/2013  . CAD (coronary artery disease) 09/24/2013  . Coronary vasospasm 09/24/2013  . Pulmonary nodule seen on  imaging study 09/24/2013  . Tobacco abuse 09/23/2013  . Unstable angina 09/22/2013    LABS    Component Value Date/Time   NA 138 09/02/2014 0922   NA 139 09/24/2013 0544   NA 138 09/23/2013 0546   K 4.6 09/02/2014 0922   K 4.0 09/24/2013 0544   K 3.8 09/23/2013 0546   CL 105 09/02/2014 0922   CL 104 09/24/2013 0544   CL 105 09/23/2013 0546   CO2 28 09/02/2014 0922   CO2 26 09/24/2013 0544   CO2 28 09/23/2013 0546   GLUCOSE 87 09/02/2014 0922   GLUCOSE 100* 09/24/2013 0544   GLUCOSE 99 09/23/2013 0546   BUN 22 09/02/2014 0922   BUN 17 09/24/2013 0544   BUN 20 09/23/2013 0546   CREATININE 0.80 09/02/2014 0922   CREATININE 0.76 09/24/2013 0544   CREATININE 0.70 09/23/2013 0546   CREATININE 0.82 09/22/2013 1806   CALCIUM 9.6 09/02/2014 0922   CALCIUM 8.9 09/24/2013 0544   CALCIUM 8.7 09/23/2013 0546   GFRNONAA >90 09/24/2013 0544   GFRNONAA >90 09/23/2013 0546   GFRNONAA >90 09/22/2013 1806   GFRAA >90 09/24/2013 0544   GFRAA >90 09/23/2013 0546   GFRAA >90 09/22/2013 1806  CMP     Component Value Date/Time   NA 138 09/02/2014 0922   K 4.6 09/02/2014 0922   CL 105 09/02/2014 0922   CO2 28 09/02/2014 0922   GLUCOSE 87 09/02/2014 0922   BUN 22 09/02/2014 0922   CREATININE 0.80 09/02/2014 0922   CREATININE 0.76 09/24/2013 0544   CALCIUM 9.6 09/02/2014 0922   PROT 6.8 09/02/2014 0922   ALBUMIN 4.1 09/02/2014 0922   AST 13 09/02/2014 0922   ALT 13 09/02/2014 0922   ALKPHOS 53 09/02/2014 0922   BILITOT 0.5 09/02/2014 0922   GFRNONAA >90 09/24/2013 0544   GFRAA >90 09/24/2013 0544       Component Value Date/Time   WBC 6.5 09/02/2014 0922   WBC 9.4 09/24/2013 0544   WBC 8.7 09/23/2013 0546   HGB 16.0 09/02/2014 0922   HGB 14.5 09/24/2013 0544   HGB 14.2 09/23/2013 0546   HCT 45.9 09/02/2014 0922   HCT 41.9 09/24/2013 0544   HCT 40.6 09/23/2013 0546   MCV 94.3 09/02/2014 0922   MCV 95.9 09/24/2013 0544   MCV 95.1 09/23/2013 0546    Lipid Panel      Component Value Date/Time   CHOL 150 09/02/2014 0922   TRIG 79 09/02/2014 0922   HDL 45 09/02/2014 0922   CHOLHDL 3.3 09/02/2014 0922   VLDL 16 09/02/2014 0922   LDLCALC 89 09/02/2014 0922    ABG No results found for: PHART, PCO2ART, PO2ART, HCO3, TCO2, ACIDBASEDEF, O2SAT   Lab Results  Component Value Date   TSH 7.790* 09/22/2013   BNP (last 3 results) No results for input(s): BNP in the last 8760 hours.  ProBNP (last 3 results) No results for input(s): PROBNP in the last 8760 hours.  Cardiac Panel (last 3 results) No results for input(s): CKTOTAL, CKMB, TROPONINI, RELINDX in the last 72 hours.  Iron/TIBC/Ferritin/ %Sat No results found for: IRON, TIBC, FERRITIN, IRONPCTSAT   EKG Orders placed or performed in visit on 09/02/15  . EKG 12-Lead     Prior Assessment and Plan Problem List as of 09/02/2015      Cardiovascular and Mediastinum   Unstable angina   CAD (coronary artery disease)   Last Assessment & Plan 09/02/2014 Office Visit Written 09/02/2014  3:54 PM by Jodelle Gross, NP    He is doing well and continues on dual antiplatelet therapy. He would like to stop the Effient if possible. . I would like to keep him on it until next office visit as his drug-eluting stent is at the proximal right coronary artery, with RCA spasms. He has not had any recurrent chest pain. It is tolerating the nitrates without evidence of dizziness or headache. I next office visit, we will take off of Effient as he will be retiring in October of 2016 and will be on a limited income.       Coronary vasospasm   Last Assessment & Plan 09/02/2014 Office Visit Written 09/02/2014  3:55 PM by Jodelle Gross, NP    No recurrent chest pain or spasming on nitrates. Continue current medication regimen.        Other   Tobacco abuse   Last Assessment & Plan 09/02/2014 Office Visit Written 09/02/2014  3:55 PM by Jodelle Gross, NP    He is advised on smoking cessation. He is not yet  completely quit.      Pulmonary nodule seen on imaging study   Last Assessment & Plan 09/30/2013 Office Visit Written 09/30/2013  1:45 PM by Dyann Kief, PA-C    Followup CT with contrast in 6 months      Hyperlipidemia   Last Assessment & Plan 09/02/2014 Office Visit Written 09/02/2014  3:55 PM by Jodelle Gross, NP    Fasting lipids LFTs and BMET are ordered. He will continue on pravastatin as directed. Refills were provided on all cardiac meds.          Imaging: No results found.

## 2015-09-02 NOTE — Patient Instructions (Signed)
Your physician wants you to follow-up in: 1 year with Joni Reining, NP. You will receive a reminder letter in the mail two months in advance. If you don't receive a letter, please call our office to schedule the follow-up appointment.  I sent refills in to your pharmacy   Your physician recommends that you return for lab work [id number], LFT's, fasting Lipids  Thanks for choosing [facility]!!!

## 2015-09-02 NOTE — Progress Notes (Signed)
Cardiology Office Note   Date:  09/02/2015   ID:  Christian Lewis, DOB Aug 18, 1956, MRN [id number]  PCP:  Fredirick Maudlin, MD  Cardiologist: Inis Sizer, NP   Chief Complaint  Patient presents with  . Coronary Artery Disease      History of Present Illness: Christian Lewis is a 59 y.o. male who presents  for ongoing assist in management of unstable angina, RCA spasm, superimposed on moderate lesion, which was found to be significant by [facility] interrogation  October 2014. The patient was treated with a bare-metal stent to the proximal RCA. He was last seen in the office in Sept of 2015  and was found to be stable without any recurrent angina symptoms. He was continued on nitrates, Effient, and amlodipine.  He comes today without cardiac complaints. He has not taken his medications yet today and his BP reflects this. He walks daily and states he is constantly on the move. He unfortunately continues to smoke. He has not had any hospitalizations, new diagnosis, or surgeries since being seen last.  He is getting ready to leave for a cruise in 2 weeks.     Past Medical History  Diagnosis Date  . CAD (coronary artery disease)     a. 09/2013 Cath/PCI: LM nl, LAD min irregs, D1/2/3 small, min irregs, LCX min irregs, OM1/2/3 nl, RI nl, RCA 50-60p w severe spasm on FFR (0.77->0.5)-->3.5x18 Vision BMS, EF 60%.  . Coronary vasospasm   . Lung nodule     a. 09/2013 CT chest with contrast: 4mm and 6mm LLL nodules **Needs f/u CT in 6-12 mos.  . Tobacco abuse   . Collagen vascular disease   . Hypertension     Past Surgical History  Procedure Laterality Date  . Left heart catheterization with coronary angiogram N/A 09/23/2013    Procedure: LEFT HEART CATHETERIZATION WITH CORONARY ANGIOGRAM;  Surgeon: Iran Ouch, MD;  Location: [facility] CATH LAB;  Service: Cardiovascular;  Laterality: N/A;  . Percutaneous coronary stent intervention (pci-s)  09/23/2013    Procedure: PERCUTANEOUS CORONARY  STENT INTERVENTION (PCI-S);  Surgeon: Iran Ouch, MD;  Location: [facility] CATH LAB;  Service: Cardiovascular;;  RCA  . Fractional flow reserve wire  09/23/2013    Procedure: FRACTIONAL FLOW RESERVE WIRE;  Surgeon: Iran Ouch, MD;  Location: [facility] CATH LAB;  Service: Cardiovascular;;  RCA     Current Outpatient Prescriptions  Medication Sig Dispense Refill  . acetaminophen (TYLENOL) 500 MG tablet Take 1,000 mg by mouth as needed. For pain    . amLODipine (NORVASC) 2.5 MG tablet Take 1 tablet (2.5 mg total) by mouth daily. 90 tablet 3  . aspirin EC 81 MG EC tablet Take 1 tablet (81 mg total) by mouth daily.    . isosorbide mononitrate (IMDUR) 30 MG 24 hr tablet Take 1 tablet (30 mg total) by mouth daily. 90 tablet 3  . nitroGLYCERIN (NITROSTAT) 0.4 MG SL tablet Place 1 tablet (0.4 mg total) under the tongue every 5 (five) minutes x 3 doses as needed for chest pain. 25 tablet 3  . prasugrel (EFFIENT) 10 MG TABS tablet Take 1 tablet (10 mg total) by mouth daily. 90 tablet 3  . pravastatin (PRAVACHOL) 20 MG tablet Take 1/2 tablet (total 10 mg po daily ) 90 tablet 1  . levothyroxine (SYNTHROID, LEVOTHROID) 25 MCG tablet Take 25 mcg by mouth daily before breakfast.      No current facility-administered medications for this visit.    Allergies:  Lipitor    Social History:  The patient  reports that he has been smoking Cigarettes.  He has a 10 pack-year smoking history. He has never used smokeless tobacco. He reports that he drinks alcohol. He reports that he does not use illicit drugs.   Family History:  The patient's family history is not on file.    ROS: All other systems are reviewed and negative. Unless otherwise mentioned in H&P    PHYSICAL EXAM: VS:  BP 186/90 mmHg  Pulse 66  Ht  (1.727 m)  Wt 137 lb (62.143 kg)  BMI 20.84 kg/m2 , BMI Body mass index is 20.84 kg/(m^2). GEN: Well nourished, well developed, in no acute distress HEENT: normal Neck: no JVD, carotid bruits, or  masses Cardiac: RRR; no murmurs, rubs, or gallops,no edema  Respiratory:  clear to auscultation bilaterally, normal work of breathing GI: soft, nontender, nondistended, + BS MS: no deformity or atrophy Skin: warm and dry, no rash Neuro:  Strength and sensation are intact Psych: euthymic mood, full affect   EKG:  The ekg ordered today demonstrates NSR rate of 67 bpm.     Lipid Panel    Component Value Date/Time   CHOL 150 09/02/2014 0922   TRIG 79 09/02/2014 0922   HDL 45 09/02/2014 0922   CHOLHDL 3.3 09/02/2014 0922   VLDL 16 09/02/2014 0922   LDLCALC 89 09/02/2014 0922      Wt Readings from Last 3 Encounters:  09/02/15 137 lb (62.143 kg)  09/02/14 135 lb (61.236 kg)  03/04/14 139 lb (63.05 kg)      Other studies Reviewed: Additional studies/ records that were reviewed today include: None Review of the above records demonstrates: None   ASSESSMENT AND PLAN:  1. CAD: DES to proximal RCA. He continues to smoke and therefore I will keep him on the Effient longer. He will continue statin, ASA and isosorbide. He will be seen in ayear unless symptomatic. I have reminded him to take his medications with him on the cruise and have a fresh bottle of NTG with him.   2. Hypercholesterolemia:  He is on pravachol but has not had recent labs. I will check fasting lipids and LFT's.   3. Tobacco abuse: I have counseled him on the importance of smoking cessation especially with CAD. He verbalizes understanding but is not inclined to quit.  4. Hypertension: He has not taken his medications today. I have brought him water to take them now as his BP is elevated.     Current medicines are reviewed at length with the patient today.    Labs/ tests ordered today include: Fasting Lipids and LFTs.   Orders Placed This Encounter  Procedures  . EKG 12-Lead     Disposition:   FU with one year   Signed, Joni Reining, NP  09/02/2015 4:09 PM    [facility] [address] [place], [place] [address] Phone: [phone]; Fax: [phone]

## 2015-09-02 NOTE — Progress Notes (Deleted)
    CARDIOLOGY CONSULT NOTE   Patient ID: Christian Lewis MRN: [id number] DOB/AGE: 60-Nov-1957 60 y.o.  Admit Date: (Not on file) Referring Physician: *** Primary Physician: Fredirick Maudlin, MD Consulting Cardiologist: *** Primary Cardiologist *** Reason for Consultation: ***  Clinical Summary Mr. Strassman is a 59 y.o.male   Allergies  Allergen Reactions  . Lipitor [Atorvastatin] Other (See Comments)    myalgia    Medications Scheduled Medications:    Infusions:    PRN Medications:     Past Medical History  Diagnosis Date  . CAD (coronary artery disease)     a. 09/2013 Cath/PCI: LM nl, LAD min irregs, D1/2/3 small, min irregs, LCX min irregs, OM1/2/3 nl, RI nl, RCA 50-60p w severe spasm on FFR (0.77->0.5)-->3.5x18 Vision BMS, EF 60%.  . Coronary vasospasm   . Lung nodule     a. 09/2013 CT chest with contrast: 4mm and 6mm LLL nodules **Needs f/u CT in 6-12 mos.  . Tobacco abuse   . Collagen vascular disease   . Hypertension     Past Surgical History  Procedure Laterality Date  . Left heart catheterization with coronary angiogram N/A 09/23/2013    Procedure: LEFT HEART CATHETERIZATION WITH CORONARY ANGIOGRAM;  Surgeon: Iran Ouch, MD;  Location: [facility] CATH LAB;  Service: Cardiovascular;  Laterality: N/A;  . Percutaneous coronary stent intervention (pci-s)  09/23/2013    Procedure: PERCUTANEOUS CORONARY STENT INTERVENTION (PCI-S);  Surgeon: Iran Ouch, MD;  Location: [facility] CATH LAB;  Service: Cardiovascular;;  RCA  . Fractional flow reserve wire  09/23/2013    Procedure: FRACTIONAL FLOW RESERVE WIRE;  Surgeon: Iran Ouch, MD;  Location: [facility] CATH LAB;  Service: Cardiovascular;;  RCA    No family history on file.  Social History Mr. Feeny reports that he has been smoking Cigarettes.  He has a 10 pack-year smoking history. He has never used smokeless tobacco. Mr. Hume reports that he drinks alcohol.  Review of Systems Complete review of systems are  found to be negative unless outlined in H&P above.  Physical Examination There were no vitals taken for this visit. @  Telemetry:  GEN: HEENT: Conjunctiva and lids normal, oropharynx clear with moist mucosa. Neck: Supple, no elevated JVP or carotid bruits, no thyromegaly. Lungs: Clear to auscultation, nonlabored breathing at rest. Cardiac: Regular rate and rhythm, no S3 or significant systolic murmur, no pericardial rub. Abdomen: Soft, nontender, no hepatomegaly, bowel sounds present, no guarding or rebound. Extremities: No pitting edema, distal pulses 2+. Skin: Warm and dry. Musculoskeletal: No kyphosis. Neuropsychiatric: Alert and oriented x3, affect grossly appropriate.  Prior Cardiac Testing/Procedures  Lab Results  Basic Metabolic Panel: No results for input(s): NA, K, CL, CO2, GLUCOSE, BUN, CREATININE, CALCIUM, MG, PHOS in the last 168 hours.  Liver Function Tests: No results for input(s): AST, ALT, ALKPHOS, BILITOT, PROT, ALBUMIN in the last 168 hours.  CBC: No results for input(s): WBC, NEUTROABS, HGB, HCT, MCV, PLT in the last 168 hours.  Cardiac Enzymes: No results for input(s): CKTOTAL, CKMB, CKMBINDEX, TROPONINI in the last 168 hours.  BNP: Invalid input(s): POCBNP   Radiology: No results found.   ECG:   Impression and Recommendations    Signed: Bettey Mare. Malea Swilling NP [facility]  09/02/2015, 7:26 AM Co-Sign MD

## 2015-09-03 ENCOUNTER — Other Ambulatory Visit (HOSPITAL_COMMUNITY)
Admission: RE | Admit: 2015-09-03 | Discharge: 2015-09-03 | Disposition: A | Discharge status: Home / Self Care | Payer: Managed Care, Other (non HMO) | Source: Ambulatory Visit | Attending: Adult Health | Admitting: Adult Health

## 2015-09-03 ENCOUNTER — Emergency Department (HOSPITAL_COMMUNITY)
Admission: EM | Admit: 2015-09-03 | Discharge: 2015-09-03 | Disposition: A | Discharge status: Home / Self Care | Payer: Managed Care, Other (non HMO) | Source: Home / Self Care

## 2015-09-03 DIAGNOSIS — E785 Hyperlipidemia, unspecified: Secondary | ICD-10-CM | POA: Insufficient documentation

## 2015-09-03 DIAGNOSIS — Z79899 Other long term (current) drug therapy: Secondary | ICD-10-CM | POA: Insufficient documentation

## 2015-09-03 LAB — BASIC METABOLIC PANEL
ANION GAP: 3 — AB (ref 5–15)
BUN: 24 mg/dL — AB (ref 6–20)
CHLORIDE: 105 mmol/L (ref 101–111)
CO2: 28 mmol/L (ref 22–32)
Calcium: 8.7 mg/dL — ABNORMAL LOW (ref 8.9–10.3)
Creatinine, Ser: 0.82 mg/dL (ref 0.61–1.24)
GFR calc Af Amer: >60 mL/min (ref >60)
GFR calc non Af Amer: >60 mL/min (ref >60)
GLUCOSE: 102 mg/dL — AB (ref 65–99)
POTASSIUM: 4 mmol/L (ref 3.5–5.1)
Sodium: 136 mmol/L (ref 135–145)

## 2015-09-03 LAB — LIPID PANEL
CHOL/HDL RATIO: 3.5 ratio
Cholesterol: 138 mg/dL (ref 0–200)
HDL: 40 mg/dL — AB (ref >40)
LDL CALC: 88 mg/dL (ref 0–99)
Triglycerides: 48 mg/dL (ref <150)
VLDL: 10 mg/dL (ref 0–40)

## 2015-09-03 LAB — HEPATIC FUNCTION PANEL
ALBUMIN: 3.7 g/dL (ref 3.5–5.0)
ALK PHOS: 58 U/L (ref 38–126)
ALT: 14 U/L — ABNORMAL LOW (ref 17–63)
AST: 14 U/L — ABNORMAL LOW (ref 15–41)
BILIRUBIN INDIRECT: 0.5 mg/dL (ref 0.3–0.9)
Bilirubin, Direct: 0.1 mg/dL (ref 0.1–0.5)
TOTAL PROTEIN: 7 g/dL (ref 6.5–8.1)
Total Bilirubin: 0.6 mg/dL (ref 0.3–1.2)

## 2015-09-05 NOTE — Progress Notes (Signed)
Quick Note:  PT made aware. Send copy to PT & to PCP. ______

## 2016-09-03 ENCOUNTER — Other Ambulatory Visit: Payer: Self-pay | Admitting: Adult Health

## 2019-10-05 ENCOUNTER — Encounter: Payer: Self-pay | Admitting: Family Medicine

## 2020-01-25 ENCOUNTER — Other Ambulatory Visit: Payer: Self-pay

## 2020-01-25 ENCOUNTER — Ambulatory Visit (INDEPENDENT_AMBULATORY_CARE_PROVIDER_SITE_OTHER): Payer: Self-pay | Admitting: Family Medicine

## 2020-01-25 ENCOUNTER — Encounter: Payer: Self-pay | Admitting: Family Medicine

## 2020-01-25 VITALS — BP 150/90 | HR 75 | Temp 98.6°F | Ht 68.0 in | Wt 152.2 lb

## 2020-01-25 DIAGNOSIS — Z720 Tobacco use: Secondary | ICD-10-CM

## 2020-01-25 DIAGNOSIS — I10 Essential (primary) hypertension: Secondary | ICD-10-CM

## 2020-01-25 DIAGNOSIS — I2510 Atherosclerotic heart disease of native coronary artery without angina pectoris: Secondary | ICD-10-CM

## 2020-01-25 DIAGNOSIS — E785 Hyperlipidemia, unspecified: Secondary | ICD-10-CM

## 2020-01-25 DIAGNOSIS — E039 Hypothyroidism, unspecified: Secondary | ICD-10-CM | POA: Insufficient documentation

## 2020-01-25 NOTE — Patient Instructions (Addendum)
Fasting labwork-recheck TSH-will call on results Hypertension, Adult Hypertension is another name for high blood pressure. High blood pressure forces your heart to work harder to pump blood. This can cause problems over time. There are two numbers in a blood pressure reading. There is a top number (systolic) over a bottom number (diastolic). It is best to have a blood pressure that is below 120/80. Healthy choices can help lower your blood pressure, or you may need medicine to help lower it. What are the causes? The cause of this condition is not known. Some conditions may be related to high blood pressure. What increases the risk?  Smoking.  Having type 2 diabetes mellitus, high cholesterol, or both.  Not getting enough exercise or physical activity.  Being overweight.  Having too much fat, sugar, calories, or salt (sodium) in your diet.  Drinking too much alcohol.  Having long-term (chronic) kidney disease.  Having a family history of high blood pressure.  Age. Risk increases with age.  Race. You may be at higher risk if you are African American.  Gender. Men are at higher risk than women before age 66. After age 33, women are at higher risk than men.  Having obstructive sleep apnea.  Stress. What are the signs or symptoms?  High blood pressure may not cause symptoms. Very high blood pressure (hypertensive crisis) may cause: ? Headache. ? Feelings of worry or nervousness (anxiety). ? Shortness of breath. ? Nosebleed. ? A feeling of being sick to your stomach (nausea). ? Throwing up (vomiting). ? Changes in how you see. ? Very bad chest pain. ? Seizures. How is this treated?  This condition is treated by making healthy lifestyle changes, such as: ? Eating healthy foods. ? Exercising more. ? Drinking less alcohol.  Your health care provider may prescribe medicine if lifestyle changes are not enough to get your blood pressure under control, and if: ? Your top  number is above 130. ? Your bottom number is above 80.  Your personal target blood pressure may vary. Follow these instructions at home: Eating and drinking   If told, follow the DASH eating plan. To follow this plan: ? Fill one half of your plate at each meal with fruits and vegetables. ? Fill one fourth of your plate at each meal with whole grains. Whole grains include whole-wheat pasta, brown rice, and whole-grain bread. ? Eat or drink low-fat dairy products, such as skim milk or low-fat yogurt. ? Fill one fourth of your plate at each meal with low-fat (lean) proteins. Low-fat proteins include fish, chicken without skin, eggs, beans, and tofu. ? Avoid fatty meat, cured and processed meat, or chicken with skin. ? Avoid pre-made or processed food.  Eat less than 1,500 mg of salt each day.  Do not drink alcohol if: ? Your doctor tells you not to drink. ? You are pregnant, may be pregnant, or are planning to become pregnant.  If you drink alcohol: ? Limit how much you use to:  0-1 drink a day for women.  0-2 drinks a day for men. ? Be aware of how much alcohol is in your drink. In the [place] one drink equals one 12 oz bottle of beer (355 mL), one 5 oz glass of wine (148 mL), or one 1 oz glass of hard liquor (44 mL). Lifestyle   Work with your doctor to stay at a healthy weight or to lose weight. Ask your doctor what the best weight is for you.  Get at  least 30 minutes of exercise most days of the week. This may include walking, swimming, or biking.  Get at least 30 minutes of exercise that strengthens your muscles (resistance exercise) at least 3 days a week. This may include lifting weights or doing Pilates.  Do not use any products that contain nicotine or tobacco, such as cigarettes, e-cigarettes, and chewing tobacco. If you need help quitting, ask your doctor.  Check your blood pressure at home as told by your doctor.  Keep all follow-up visits as told by your doctor.  This is important. Medicines  Take over-the-counter and prescription medicines only as told by your doctor. Follow directions carefully.  Do not skip doses of blood pressure medicine. The medicine does not work as well if you skip doses. Skipping doses also puts you at risk for problems.  Ask your doctor about side effects or reactions to medicines that you should watch for. Contact a doctor if you:  Think you are having a reaction to the medicine you are taking.  Have headaches that keep coming back (recurring).  Feel dizzy.  Have swelling in your ankles.  Have trouble with your vision. Get help right away if you:  Get a very bad headache.  Start to feel mixed up (confused).  Feel weak or numb.  Feel faint.  Have very bad pain in your: ? Chest. ? Belly (abdomen).  Throw up more than once.  Have trouble breathing. Summary  Hypertension is another name for high blood pressure.  High blood pressure forces your heart to work harder to pump blood.  For most people, a normal blood pressure is less than 120/80.  Making healthy choices can help lower blood pressure. If your blood pressure does not get lower with healthy choices, you may need to take medicine. This information is not intended to replace advice given to you by your health care provider. Make sure you discuss any questions you have with your health care provider. Document Revised: 08/20/2018 Document Reviewed: 08/20/2018 Elsevier Patient Education  [employer].   COVID-19 Vaccine Information can be found at: ShippingScam.co.uk For questions related to vaccine distribution or appointments, please email vaccine@[email or web address] .com or call 360-638-2767.

## 2020-01-25 NOTE — Progress Notes (Signed)
New Patient Office Visit  Subjective:  Patient ID: Christian Lewis, male    DOB: [birth year]  Age: 64 y.o. MRN: [id number]  CC:  Chief Complaint  Patient presents with  . Establish Care  HTN/GERD/Hypothyroid/hyperlipidemia  HPI LORENZO ARSCOTT presents for HTN-takes norvasc daily-post stent placement-no recent cardio evaluation-self pay and does not want expense. Pt with effient long term per cardiology.  No increase use of nitro-pt states out of date Hyperlipidemia-pravachol daily-no concerns Hypothyroid-synthroid daily-no recent TSH-pt was started on synthroid prior to Dr. Juanetta Gosling retirement-TSH 10.14 GERD-pt now taking pepcid otc with no concerns tob use 1pk/day-pt is not interested in quitting-understands CAD and risk   Past Medical History:  Diagnosis Date  . CAD (coronary artery disease)    a. 09/2013 Cath/PCI: LM nl, LAD min irregs, D1/2/3 small, min irregs, LCX min irregs, OM1/2/3 nl, RI nl, RCA 50-60p w severe spasm on FFR (0.77->0.5)-->3.5x18 Vision BMS, EF 60%.  . Collagen vascular disease (HCC)   . Coronary vasospasm (HCC)   . Hypertension   . Lung nodule    a. 09/2013 CT chest with contrast: 77mm and 67mm LLL nodules **Needs f/u CT in 6-12 mos.  . Tobacco abuse     Past Surgical History:  Procedure Laterality Date  . FRACTIONAL FLOW RESERVE WIRE  09/23/2013   Procedure: FRACTIONAL FLOW RESERVE WIRE;  Surgeon: Iran Ouch, MD;  Location: [facility] CATH LAB;  Service: Cardiovascular;;  RCA  . LEFT HEART CATHETERIZATION WITH CORONARY ANGIOGRAM N/A 09/23/2013   Procedure: LEFT HEART CATHETERIZATION WITH CORONARY ANGIOGRAM;  Surgeon: Iran Ouch, MD;  Location: [facility] CATH LAB;  Service: Cardiovascular;  Laterality: N/A;  . PERCUTANEOUS CORONARY STENT INTERVENTION (PCI-S)  09/23/2013   Procedure: PERCUTANEOUS CORONARY STENT INTERVENTION (PCI-S);  Surgeon: Iran Ouch, MD;  Location: [facility] CATH LAB;  Service: Cardiovascular;;  RCA    No family history on file.  Social  History   Socioeconomic History  . Marital status: Married    Spouse name: Not on file  . Number of children: Not on file  . Years of education: Not on file  . Highest education level: Not on file  Occupational History  . Not on file  Tobacco Use  . Smoking status: Current Every Day Smoker    Packs/day: 0.25    Years: 40.00    Pack years: 10.00    Types: Cigarettes  . Smokeless tobacco: Never Used  . Tobacco comment: pt notes slowed down to 6 cigs daily 09-24-13  Substance and Sexual Activity  . Alcohol use: Yes    Comment: occ  . Drug use: No  . Sexual activity: Not on file  Other Topics Concern  . Not on file  Social History Narrative  . Not on file   Social Determinants of Health   Financial Resource Strain:   . Difficulty of Paying Living Expenses: Not on file  Food Insecurity:   . Worried About Programme researcher, broadcasting/film/video in the Last Year: Not on file  . Ran Out of Food in the Last Year: Not on file  Transportation Needs:   . Lack of Transportation (Medical): Not on file  . Lack of Transportation (Non-Medical): Not on file  Physical Activity:   . Days of Exercise per Week: Not on file  . Minutes of Exercise per Session: Not on file  Stress:   . Feeling of Stress : Not on file  Social Connections:   . Frequency of Communication with Friends and Family:  Not on file  . Frequency of Social Gatherings with Friends and Family: Not on file  . Attends Religious Services: Not on file  . Active Member of Clubs or Organizations: Not on file  . Attends Banker Meetings: Not on file  . Marital Status: Not on file  Intimate Partner Violence:   . Fear of Current or Ex-Partner: Not on file  . Emotionally Abused: Not on file  . Physically Abused: Not on file  . Sexually Abused: Not on file    ROS Review of Systems  Constitutional: Negative.   HENT: Negative.   Eyes:       Glasses  Respiratory: Negative.   Cardiovascular:       2014 stent LDL 81   Gastrointestinal:       GERD  Endocrine:       Glucose 103-A1c 5.8%  Genitourinary:       Psa 1.3  Musculoskeletal: Negative.   Allergic/Immunologic: Negative.   Neurological: Negative.   Hematological: Negative.   Psychiatric/Behavioral: Negative.     Objective:   Today's Vitals: BP (!) 150/90 (BP Location: Left Arm, Patient Position: Sitting, Cuff Size: Normal)   Pulse 75   Temp 98.6 F (37 C) (Temporal)   Ht 5\' 8"  (1.727 m)   Wt 152 lb 3.2 oz (69 kg)   SpO2 99%   BMI 23.14 kg/m  Recheck left arm manual 122/80-pt admitted to smoking prior to coming into his appointment in the car Physical Exam Vitals reviewed.  Constitutional:      Appearance: Normal appearance.  HENT:     Head: Normocephalic and atraumatic.  Cardiovascular:     Rate and Rhythm: Normal rate and regular rhythm.     Pulses: Normal pulses.     Heart sounds: Normal heart sounds.  Pulmonary:     Effort: Pulmonary effort is normal.     Breath sounds: Normal breath sounds.  Musculoskeletal:     Cervical back: Normal range of motion and neck supple.  Neurological:     Mental Status: He is oriented to person, place, and time.  Psychiatric:        Mood and Affect: Mood normal.     Assessment & Plan:  1. Tobacco abuse Pt smokes 1 pk/day-no desire to quit currently  2. Coronary artery disease involving native coronary artery of native heart without angina pectoris Stent in 2014-pt does not want cardiology follow up-self pay -no insurance  3. Hyperlipidemia, unspecified hyperlipidemia type pravachol-lft/lipid panel  4. Hypothyroidism, unspecified type TSH-recheck today at [facility] for f/u-started 3 months ago on 09-21-1986 synthroid  5. Essential hypertension Renal function 10/20-normal -norvasc daily-elevated on arrival today, with recheck after tob use normal range  Outpatient Encounter Medications as of 01/25/2020  Medication Sig  . acetaminophen (TYLENOL) 500 MG tablet Take 1,000 mg by mouth as  needed. For pain  . amLODipine (NORVASC) 2.5 MG tablet TAKE 1 TABLET BY MOUTH DAILY  . aspirin EC 81 MG EC tablet Take 1 tablet (81 mg total) by mouth daily.  . isosorbide mononitrate (IMDUR) 30 MG 24 hr tablet TAKE 1 TABLET BY MOUTH DAILY  . levothyroxine (SYNTHROID, LEVOTHROID) 25 MCG tablet Take 50 mcg by mouth daily before breakfast.   . nitroGLYCERIN (NITROSTAT) 0.4 MG SL tablet Place 1 tablet (0.4 mg total) under the tongue every 5 (five) minutes x 3 doses as needed for chest pain.  . prasugrel (EFFIENT) 10 MG TABS tablet Take 1 tablet (10 mg total) by mouth  daily.  . pravastatin (PRAVACHOL) 20 MG tablet TAKE ONE-HALF (1/2) TABLET BY MOUTH DAILY   No facility-administered encounter medications on file as of 01/25/2020.    Follow-up: 39months  Raiford Fetterman Hannah Beat, MD

## 2020-01-26 ENCOUNTER — Encounter: Payer: Self-pay | Admitting: Family Medicine

## 2020-01-27 ENCOUNTER — Other Ambulatory Visit: Payer: Self-pay | Admitting: Family Medicine

## 2020-01-27 DIAGNOSIS — E039 Hypothyroidism, unspecified: Secondary | ICD-10-CM

## 2020-01-27 LAB — TSH: TSH: 16.54 mIU/L — ABNORMAL HIGH (ref 0.40–4.50)

## 2020-01-27 MED ORDER — LEVOTHYROXINE SODIUM 75 MCG PO TABS: 75.0000 ug | ORAL_TABLET | Freq: Every day | ORAL | 1 refills | Status: DC

## 2020-03-01 ENCOUNTER — Other Ambulatory Visit: Payer: Self-pay

## 2020-03-01 DIAGNOSIS — I10 Essential (primary) hypertension: Secondary | ICD-10-CM

## 2020-03-01 MED ORDER — ISOSORBIDE MONONITRATE ER 30 MG PO TB24: 30.0000 mg | ORAL_TABLET | Freq: Every day | ORAL | 2 refills | Status: AC

## 2020-03-18 LAB — TSH: TSH: 9.33 mIU/L — ABNORMAL HIGH (ref 0.40–4.50)

## 2020-03-21 ENCOUNTER — Other Ambulatory Visit: Payer: Self-pay | Admitting: Family Medicine

## 2020-03-21 ENCOUNTER — Telehealth: Payer: Self-pay | Admitting: Emergency Medicine

## 2020-03-21 DIAGNOSIS — E039 Hypothyroidism, unspecified: Secondary | ICD-10-CM

## 2020-03-21 MED ORDER — LEVOTHYROXINE SODIUM 88 MCG PO TABS: 88.0000 ug | ORAL_TABLET | Freq: Every day | ORAL | 1 refills | Status: DC

## 2020-03-21 NOTE — Progress Notes (Signed)
Change in thyroid medication

## 2020-03-21 NOTE — Telephone Encounter (Signed)
Per Dr Judee Clara in office chat msg patient was informed of the below msg from Dr Judee Clara "pt needs to increase dose to of synthroid and then have TSH drawn in 6 weeks-important to see if he will need additional medication before I am no longer there. "

## 2020-03-22 ENCOUNTER — Telehealth: Payer: Self-pay | Admitting: Emergency Medicine

## 2020-03-22 NOTE — Telephone Encounter (Signed)
-----   Message from Wandra Feinstein, MD sent at 03/21/2020 12:15 PM EDT ----- We need to change the dose of your thyroid medication-you are not yet at goal-increase to daily. I sent medication to pharmacy

## 2020-03-22 NOTE — Telephone Encounter (Signed)
Patient was informed and was told to f/u in 6 weeks

## 2020-04-18 ENCOUNTER — Other Ambulatory Visit: Payer: Self-pay | Admitting: Emergency Medicine

## 2020-04-18 DIAGNOSIS — E039 Hypothyroidism, unspecified: Secondary | ICD-10-CM

## 2020-04-18 DIAGNOSIS — I10 Essential (primary) hypertension: Secondary | ICD-10-CM

## 2020-04-18 MED ORDER — LEVOTHYROXINE SODIUM 88 MCG PO TABS: 88.0000 ug | ORAL_TABLET | Freq: Every day | ORAL | 1 refills | Status: DC

## 2020-04-18 MED ORDER — AMLODIPINE BESYLATE 2.5 MG PO TABS: 2.5000 mg | ORAL_TABLET | Freq: Every day | ORAL | 2 refills | Status: AC

## 2020-04-19 ENCOUNTER — Telehealth: Payer: Self-pay | Admitting: Family Medicine

## 2020-04-19 NOTE — Telephone Encounter (Signed)
Patient called and states the following medication was also supposed to be called in yesterday but it was not. Can you check on this.  pravastatin (PRAVACHOL) 20 MG tablet   [facility] [address], [place] - [address] [place] [address] Phone:  [phone]  Fax:  [phone]

## 2020-04-20 ENCOUNTER — Other Ambulatory Visit: Payer: Self-pay | Admitting: Emergency Medicine

## 2020-04-20 NOTE — Telephone Encounter (Signed)
Some patients can tolerate pravastatin better than other statins-his allergy was muscle pain which is a side effect to most statins

## 2020-04-20 NOTE — Telephone Encounter (Signed)
Patient is requesting a refill on this med  but is allergic to atorvastatin med please inform if this is ok to send

## 2020-04-21 ENCOUNTER — Encounter: Payer: Self-pay | Admitting: Emergency Medicine

## 2020-04-21 ENCOUNTER — Other Ambulatory Visit: Payer: Self-pay | Admitting: Emergency Medicine

## 2020-04-21 DIAGNOSIS — E785 Hyperlipidemia, unspecified: Secondary | ICD-10-CM

## 2020-04-21 LAB — TSH: TSH: 3.12 mIU/L (ref 0.40–4.50)

## 2020-04-21 MED ORDER — PRAVASTATIN SODIUM 20 MG PO TABS: ORAL_TABLET | ORAL | 2 refills | Status: AC

## 2020-04-21 NOTE — Telephone Encounter (Signed)
Rx was sent by DR Judee Clara

## 2020-04-21 NOTE — Progress Notes (Signed)
Lab letter mailed. 

## 2020-04-25 ENCOUNTER — Ambulatory Visit: Payer: Self-pay | Admitting: Family Medicine

## 2020-05-09 ENCOUNTER — Other Ambulatory Visit: Payer: Self-pay | Admitting: Family Medicine

## 2020-05-09 DIAGNOSIS — E039 Hypothyroidism, unspecified: Secondary | ICD-10-CM

## 2020-05-18 ENCOUNTER — Ambulatory Visit: Payer: Self-pay | Admitting: Family Medicine

## 2020-07-25 ENCOUNTER — Ambulatory Visit: Payer: Self-pay | Admitting: Family Medicine

## 2020-08-09 DIAGNOSIS — E785 Hyperlipidemia, unspecified: Secondary | ICD-10-CM | POA: Diagnosis not present

## 2020-08-09 DIAGNOSIS — E039 Hypothyroidism, unspecified: Secondary | ICD-10-CM | POA: Diagnosis not present

## 2020-08-09 DIAGNOSIS — I10 Essential (primary) hypertension: Secondary | ICD-10-CM | POA: Diagnosis not present

## 2020-08-09 DIAGNOSIS — Z0000 Encounter for general adult medical examination without abnormal findings: Secondary | ICD-10-CM | POA: Diagnosis not present

## 2020-08-17 DIAGNOSIS — I25119 Atherosclerotic heart disease of native coronary artery with unspecified angina pectoris: Secondary | ICD-10-CM | POA: Diagnosis not present

## 2020-08-17 DIAGNOSIS — E785 Hyperlipidemia, unspecified: Secondary | ICD-10-CM | POA: Diagnosis not present

## 2020-08-17 DIAGNOSIS — E039 Hypothyroidism, unspecified: Secondary | ICD-10-CM | POA: Diagnosis not present

## 2020-08-17 DIAGNOSIS — D751 Secondary polycythemia: Secondary | ICD-10-CM | POA: Diagnosis not present

## 2020-08-17 DIAGNOSIS — I10 Essential (primary) hypertension: Secondary | ICD-10-CM | POA: Diagnosis not present

## 2020-09-13 DIAGNOSIS — E039 Hypothyroidism, unspecified: Secondary | ICD-10-CM | POA: Diagnosis not present

## 2020-09-13 DIAGNOSIS — E785 Hyperlipidemia, unspecified: Secondary | ICD-10-CM | POA: Diagnosis not present

## 2020-09-13 DIAGNOSIS — Z0000 Encounter for general adult medical examination without abnormal findings: Secondary | ICD-10-CM | POA: Diagnosis not present

## 2020-09-13 DIAGNOSIS — R7301 Impaired fasting glucose: Secondary | ICD-10-CM | POA: Diagnosis not present

## 2020-09-19 DIAGNOSIS — R7303 Prediabetes: Secondary | ICD-10-CM | POA: Diagnosis not present

## 2020-09-19 DIAGNOSIS — E785 Hyperlipidemia, unspecified: Secondary | ICD-10-CM | POA: Diagnosis not present

## 2020-09-19 DIAGNOSIS — I25119 Atherosclerotic heart disease of native coronary artery with unspecified angina pectoris: Secondary | ICD-10-CM | POA: Diagnosis not present

## 2020-09-19 DIAGNOSIS — D751 Secondary polycythemia: Secondary | ICD-10-CM | POA: Diagnosis not present

## 2020-09-19 DIAGNOSIS — E039 Hypothyroidism, unspecified: Secondary | ICD-10-CM | POA: Diagnosis not present

## 2020-09-19 DIAGNOSIS — I10 Essential (primary) hypertension: Secondary | ICD-10-CM | POA: Diagnosis not present

## 2021-01-27 DIAGNOSIS — R7303 Prediabetes: Secondary | ICD-10-CM | POA: Diagnosis not present

## 2021-01-27 DIAGNOSIS — E039 Hypothyroidism, unspecified: Secondary | ICD-10-CM | POA: Diagnosis not present

## 2021-01-27 DIAGNOSIS — E785 Hyperlipidemia, unspecified: Secondary | ICD-10-CM | POA: Diagnosis not present

## 2021-01-30 DIAGNOSIS — R7303 Prediabetes: Secondary | ICD-10-CM | POA: Diagnosis not present

## 2021-01-30 DIAGNOSIS — I10 Essential (primary) hypertension: Secondary | ICD-10-CM | POA: Diagnosis not present

## 2021-01-30 DIAGNOSIS — E039 Hypothyroidism, unspecified: Secondary | ICD-10-CM | POA: Diagnosis not present

## 2021-01-30 DIAGNOSIS — E785 Hyperlipidemia, unspecified: Secondary | ICD-10-CM | POA: Diagnosis not present

## 2021-01-30 DIAGNOSIS — D751 Secondary polycythemia: Secondary | ICD-10-CM | POA: Diagnosis not present

## 2021-01-30 DIAGNOSIS — I25119 Atherosclerotic heart disease of native coronary artery with unspecified angina pectoris: Secondary | ICD-10-CM | POA: Diagnosis not present

## 2021-01-30 DIAGNOSIS — K219 Gastro-esophageal reflux disease without esophagitis: Secondary | ICD-10-CM | POA: Diagnosis not present

## 2021-02-17 ENCOUNTER — Encounter (INDEPENDENT_AMBULATORY_CARE_PROVIDER_SITE_OTHER): Payer: Self-pay | Admitting: *Deleted

## 2021-02-27 DIAGNOSIS — E039 Hypothyroidism, unspecified: Secondary | ICD-10-CM | POA: Diagnosis not present

## 2021-03-24 ENCOUNTER — Other Ambulatory Visit: Payer: Self-pay | Admitting: Family Medicine

## 2021-03-24 DIAGNOSIS — I10 Essential (primary) hypertension: Secondary | ICD-10-CM

## 2021-08-07 ENCOUNTER — Ambulatory Visit: Payer: Self-pay | Admitting: Nurse Practitioner

## 2021-08-08 DIAGNOSIS — R7303 Prediabetes: Secondary | ICD-10-CM | POA: Diagnosis not present

## 2021-08-08 DIAGNOSIS — I10 Essential (primary) hypertension: Secondary | ICD-10-CM | POA: Diagnosis not present

## 2021-08-08 DIAGNOSIS — E782 Mixed hyperlipidemia: Secondary | ICD-10-CM | POA: Diagnosis not present

## 2021-08-10 DIAGNOSIS — I10 Essential (primary) hypertension: Secondary | ICD-10-CM | POA: Diagnosis not present

## 2021-08-10 DIAGNOSIS — Z0001 Encounter for general adult medical examination with abnormal findings: Secondary | ICD-10-CM | POA: Diagnosis not present

## 2021-08-10 DIAGNOSIS — E785 Hyperlipidemia, unspecified: Secondary | ICD-10-CM | POA: Diagnosis not present

## 2021-08-10 DIAGNOSIS — E782 Mixed hyperlipidemia: Secondary | ICD-10-CM | POA: Diagnosis not present

## 2021-08-10 DIAGNOSIS — D751 Secondary polycythemia: Secondary | ICD-10-CM | POA: Diagnosis not present

## 2021-08-10 DIAGNOSIS — K219 Gastro-esophageal reflux disease without esophagitis: Secondary | ICD-10-CM | POA: Diagnosis not present

## 2021-08-10 DIAGNOSIS — E039 Hypothyroidism, unspecified: Secondary | ICD-10-CM | POA: Diagnosis not present

## 2021-08-10 DIAGNOSIS — R7303 Prediabetes: Secondary | ICD-10-CM | POA: Diagnosis not present

## 2021-08-10 DIAGNOSIS — L989 Disorder of the skin and subcutaneous tissue, unspecified: Secondary | ICD-10-CM | POA: Diagnosis not present

## 2021-08-10 DIAGNOSIS — I25119 Atherosclerotic heart disease of native coronary artery with unspecified angina pectoris: Secondary | ICD-10-CM | POA: Diagnosis not present

## 2021-08-16 DIAGNOSIS — Z720 Tobacco use: Secondary | ICD-10-CM | POA: Diagnosis not present

## 2021-08-16 DIAGNOSIS — I10 Essential (primary) hypertension: Secondary | ICD-10-CM | POA: Diagnosis not present

## 2021-08-16 DIAGNOSIS — K219 Gastro-esophageal reflux disease without esophagitis: Secondary | ICD-10-CM | POA: Diagnosis not present

## 2021-08-16 DIAGNOSIS — Z833 Family history of diabetes mellitus: Secondary | ICD-10-CM | POA: Diagnosis not present

## 2021-08-16 DIAGNOSIS — E039 Hypothyroidism, unspecified: Secondary | ICD-10-CM | POA: Diagnosis not present

## 2021-08-16 DIAGNOSIS — K08409 Partial loss of teeth, unspecified cause, unspecified class: Secondary | ICD-10-CM | POA: Diagnosis not present

## 2021-08-16 DIAGNOSIS — I25119 Atherosclerotic heart disease of native coronary artery with unspecified angina pectoris: Secondary | ICD-10-CM | POA: Diagnosis not present

## 2021-08-16 DIAGNOSIS — Z825 Family history of asthma and other chronic lower respiratory diseases: Secondary | ICD-10-CM | POA: Diagnosis not present

## 2021-08-16 DIAGNOSIS — E785 Hyperlipidemia, unspecified: Secondary | ICD-10-CM | POA: Diagnosis not present

## 2021-08-16 DIAGNOSIS — Z7982 Long term (current) use of aspirin: Secondary | ICD-10-CM | POA: Diagnosis not present

## 2021-08-16 DIAGNOSIS — Z008 Encounter for other general examination: Secondary | ICD-10-CM | POA: Diagnosis not present

## 2021-09-06 DIAGNOSIS — I25119 Atherosclerotic heart disease of native coronary artery with unspecified angina pectoris: Secondary | ICD-10-CM | POA: Diagnosis not present

## 2021-09-06 DIAGNOSIS — I10 Essential (primary) hypertension: Secondary | ICD-10-CM | POA: Diagnosis not present

## 2021-09-06 DIAGNOSIS — R059 Cough, unspecified: Secondary | ICD-10-CM | POA: Diagnosis not present

## 2022-02-09 DIAGNOSIS — E039 Hypothyroidism, unspecified: Secondary | ICD-10-CM | POA: Diagnosis not present

## 2022-02-09 DIAGNOSIS — R7303 Prediabetes: Secondary | ICD-10-CM | POA: Diagnosis not present

## 2022-02-09 DIAGNOSIS — I10 Essential (primary) hypertension: Secondary | ICD-10-CM | POA: Diagnosis not present

## 2022-02-13 DIAGNOSIS — L989 Disorder of the skin and subcutaneous tissue, unspecified: Secondary | ICD-10-CM | POA: Diagnosis not present

## 2022-02-13 DIAGNOSIS — K219 Gastro-esophageal reflux disease without esophagitis: Secondary | ICD-10-CM | POA: Diagnosis not present

## 2022-02-13 DIAGNOSIS — I25119 Atherosclerotic heart disease of native coronary artery with unspecified angina pectoris: Secondary | ICD-10-CM | POA: Diagnosis not present

## 2022-02-13 DIAGNOSIS — E039 Hypothyroidism, unspecified: Secondary | ICD-10-CM | POA: Diagnosis not present

## 2022-02-13 DIAGNOSIS — Z1211 Encounter for screening for malignant neoplasm of colon: Secondary | ICD-10-CM | POA: Diagnosis not present

## 2022-02-13 DIAGNOSIS — E785 Hyperlipidemia, unspecified: Secondary | ICD-10-CM | POA: Diagnosis not present

## 2022-02-13 DIAGNOSIS — R7303 Prediabetes: Secondary | ICD-10-CM | POA: Diagnosis not present

## 2022-02-13 DIAGNOSIS — D751 Secondary polycythemia: Secondary | ICD-10-CM | POA: Diagnosis not present

## 2022-02-13 DIAGNOSIS — I10 Essential (primary) hypertension: Secondary | ICD-10-CM | POA: Diagnosis not present

## 2022-02-21 DIAGNOSIS — Z1211 Encounter for screening for malignant neoplasm of colon: Secondary | ICD-10-CM | POA: Diagnosis not present

## 2022-06-19 DIAGNOSIS — Z833 Family history of diabetes mellitus: Secondary | ICD-10-CM | POA: Diagnosis not present

## 2022-06-19 DIAGNOSIS — Z720 Tobacco use: Secondary | ICD-10-CM | POA: Diagnosis not present

## 2022-06-19 DIAGNOSIS — E039 Hypothyroidism, unspecified: Secondary | ICD-10-CM | POA: Diagnosis not present

## 2022-06-19 DIAGNOSIS — Z825 Family history of asthma and other chronic lower respiratory diseases: Secondary | ICD-10-CM | POA: Diagnosis not present

## 2022-06-19 DIAGNOSIS — I208 Other forms of angina pectoris: Secondary | ICD-10-CM | POA: Diagnosis not present

## 2022-06-19 DIAGNOSIS — I10 Essential (primary) hypertension: Secondary | ICD-10-CM | POA: Diagnosis not present

## 2022-06-19 DIAGNOSIS — E785 Hyperlipidemia, unspecified: Secondary | ICD-10-CM | POA: Diagnosis not present

## 2022-06-19 DIAGNOSIS — Z7982 Long term (current) use of aspirin: Secondary | ICD-10-CM | POA: Diagnosis not present

## 2022-06-19 DIAGNOSIS — K219 Gastro-esophageal reflux disease without esophagitis: Secondary | ICD-10-CM | POA: Diagnosis not present

## 2022-08-13 DIAGNOSIS — I10 Essential (primary) hypertension: Secondary | ICD-10-CM | POA: Diagnosis not present

## 2022-08-13 DIAGNOSIS — R7303 Prediabetes: Secondary | ICD-10-CM | POA: Diagnosis not present

## 2022-08-13 DIAGNOSIS — E785 Hyperlipidemia, unspecified: Secondary | ICD-10-CM | POA: Diagnosis not present

## 2022-08-13 DIAGNOSIS — E039 Hypothyroidism, unspecified: Secondary | ICD-10-CM | POA: Diagnosis not present

## 2022-08-21 ENCOUNTER — Other Ambulatory Visit (HOSPITAL_COMMUNITY): Payer: Self-pay | Admitting: Family Medicine

## 2022-08-21 DIAGNOSIS — K219 Gastro-esophageal reflux disease without esophagitis: Secondary | ICD-10-CM | POA: Diagnosis not present

## 2022-08-21 DIAGNOSIS — D751 Secondary polycythemia: Secondary | ICD-10-CM | POA: Diagnosis not present

## 2022-08-21 DIAGNOSIS — Z0001 Encounter for general adult medical examination with abnormal findings: Secondary | ICD-10-CM | POA: Diagnosis not present

## 2022-08-21 DIAGNOSIS — E785 Hyperlipidemia, unspecified: Secondary | ICD-10-CM | POA: Diagnosis not present

## 2022-08-21 DIAGNOSIS — I10 Essential (primary) hypertension: Secondary | ICD-10-CM | POA: Diagnosis not present

## 2022-08-21 DIAGNOSIS — Z122 Encounter for screening for malignant neoplasm of respiratory organs: Secondary | ICD-10-CM | POA: Diagnosis not present

## 2022-08-21 DIAGNOSIS — I25119 Atherosclerotic heart disease of native coronary artery with unspecified angina pectoris: Secondary | ICD-10-CM | POA: Diagnosis not present

## 2022-08-21 DIAGNOSIS — R7303 Prediabetes: Secondary | ICD-10-CM | POA: Diagnosis not present

## 2022-08-21 DIAGNOSIS — R69 Illness, unspecified: Secondary | ICD-10-CM | POA: Diagnosis not present

## 2022-08-21 DIAGNOSIS — L989 Disorder of the skin and subcutaneous tissue, unspecified: Secondary | ICD-10-CM | POA: Diagnosis not present

## 2022-08-21 DIAGNOSIS — Z125 Encounter for screening for malignant neoplasm of prostate: Secondary | ICD-10-CM | POA: Diagnosis not present

## 2022-08-21 DIAGNOSIS — E039 Hypothyroidism, unspecified: Secondary | ICD-10-CM | POA: Diagnosis not present

## 2022-08-30 ENCOUNTER — Other Ambulatory Visit (HOSPITAL_COMMUNITY): Payer: Self-pay | Admitting: Family Medicine

## 2022-08-30 ENCOUNTER — Other Ambulatory Visit: Payer: Self-pay | Admitting: Family Medicine

## 2022-08-30 DIAGNOSIS — Z122 Encounter for screening for malignant neoplasm of respiratory organs: Secondary | ICD-10-CM

## 2022-10-03 DIAGNOSIS — E039 Hypothyroidism, unspecified: Secondary | ICD-10-CM | POA: Diagnosis not present

## 2022-10-08 ENCOUNTER — Ambulatory Visit (HOSPITAL_COMMUNITY)
Admission: RE | Admit: 2022-10-08 | Discharge: 2022-10-08 | Disposition: A | Discharge status: Home / Self Care | Payer: Medicare HMO | Source: Ambulatory Visit | Attending: Family Medicine | Admitting: Family Medicine

## 2022-10-08 DIAGNOSIS — Z122 Encounter for screening for malignant neoplasm of respiratory organs: Secondary | ICD-10-CM | POA: Diagnosis not present

## 2022-10-08 DIAGNOSIS — F17210 Nicotine dependence, cigarettes, uncomplicated: Secondary | ICD-10-CM | POA: Diagnosis not present

## 2022-10-08 DIAGNOSIS — R69 Illness, unspecified: Secondary | ICD-10-CM | POA: Diagnosis not present

## 2023-02-15 DIAGNOSIS — E785 Hyperlipidemia, unspecified: Secondary | ICD-10-CM | POA: Diagnosis not present

## 2023-02-15 DIAGNOSIS — Z125 Encounter for screening for malignant neoplasm of prostate: Secondary | ICD-10-CM | POA: Diagnosis not present

## 2023-02-15 DIAGNOSIS — E039 Hypothyroidism, unspecified: Secondary | ICD-10-CM | POA: Diagnosis not present

## 2023-02-15 DIAGNOSIS — R7303 Prediabetes: Secondary | ICD-10-CM | POA: Diagnosis not present

## 2023-02-21 DIAGNOSIS — I10 Essential (primary) hypertension: Secondary | ICD-10-CM | POA: Diagnosis not present

## 2023-02-21 DIAGNOSIS — R7303 Prediabetes: Secondary | ICD-10-CM | POA: Diagnosis not present

## 2023-02-21 DIAGNOSIS — K219 Gastro-esophageal reflux disease without esophagitis: Secondary | ICD-10-CM | POA: Diagnosis not present

## 2023-02-21 DIAGNOSIS — R69 Illness, unspecified: Secondary | ICD-10-CM | POA: Diagnosis not present

## 2023-02-21 DIAGNOSIS — I2510 Atherosclerotic heart disease of native coronary artery without angina pectoris: Secondary | ICD-10-CM | POA: Diagnosis not present

## 2023-02-21 DIAGNOSIS — Z0001 Encounter for general adult medical examination with abnormal findings: Secondary | ICD-10-CM | POA: Diagnosis not present

## 2023-02-21 DIAGNOSIS — M25511 Pain in right shoulder: Secondary | ICD-10-CM | POA: Diagnosis not present

## 2023-02-21 DIAGNOSIS — E785 Hyperlipidemia, unspecified: Secondary | ICD-10-CM | POA: Diagnosis not present

## 2023-02-21 DIAGNOSIS — I25119 Atherosclerotic heart disease of native coronary artery with unspecified angina pectoris: Secondary | ICD-10-CM | POA: Diagnosis not present

## 2023-02-21 DIAGNOSIS — D751 Secondary polycythemia: Secondary | ICD-10-CM | POA: Diagnosis not present

## 2023-02-21 DIAGNOSIS — E039 Hypothyroidism, unspecified: Secondary | ICD-10-CM | POA: Diagnosis not present

## 2023-02-21 DIAGNOSIS — Z79890 Hormone replacement therapy (postmenopausal): Secondary | ICD-10-CM | POA: Diagnosis not present

## 2023-03-13 DIAGNOSIS — W57XXXA Bitten or stung by nonvenomous insect and other nonvenomous arthropods, initial encounter: Secondary | ICD-10-CM | POA: Diagnosis not present

## 2023-03-13 DIAGNOSIS — T148XXA Other injury of unspecified body region, initial encounter: Secondary | ICD-10-CM | POA: Diagnosis not present

## 2023-03-13 DIAGNOSIS — S90562A Insect bite (nonvenomous), left ankle, initial encounter: Secondary | ICD-10-CM | POA: Diagnosis not present

## 2023-06-14 DIAGNOSIS — E039 Hypothyroidism, unspecified: Secondary | ICD-10-CM | POA: Diagnosis not present

## 2023-06-14 DIAGNOSIS — R7303 Prediabetes: Secondary | ICD-10-CM | POA: Diagnosis not present

## 2023-06-14 DIAGNOSIS — Z125 Encounter for screening for malignant neoplasm of prostate: Secondary | ICD-10-CM | POA: Diagnosis not present

## 2023-06-14 DIAGNOSIS — E785 Hyperlipidemia, unspecified: Secondary | ICD-10-CM | POA: Diagnosis not present

## 2023-06-20 DIAGNOSIS — E039 Hypothyroidism, unspecified: Secondary | ICD-10-CM | POA: Diagnosis not present

## 2023-06-20 DIAGNOSIS — K219 Gastro-esophageal reflux disease without esophagitis: Secondary | ICD-10-CM | POA: Diagnosis not present

## 2023-06-20 DIAGNOSIS — R7303 Prediabetes: Secondary | ICD-10-CM | POA: Diagnosis not present

## 2023-06-20 DIAGNOSIS — I25119 Atherosclerotic heart disease of native coronary artery with unspecified angina pectoris: Secondary | ICD-10-CM | POA: Diagnosis not present

## 2023-06-20 DIAGNOSIS — E785 Hyperlipidemia, unspecified: Secondary | ICD-10-CM | POA: Diagnosis not present

## 2023-06-20 DIAGNOSIS — M25511 Pain in right shoulder: Secondary | ICD-10-CM | POA: Diagnosis not present

## 2023-06-20 DIAGNOSIS — D751 Secondary polycythemia: Secondary | ICD-10-CM | POA: Diagnosis not present

## 2023-06-20 DIAGNOSIS — F17210 Nicotine dependence, cigarettes, uncomplicated: Secondary | ICD-10-CM | POA: Diagnosis not present

## 2023-06-20 DIAGNOSIS — I2510 Atherosclerotic heart disease of native coronary artery without angina pectoris: Secondary | ICD-10-CM | POA: Diagnosis not present

## 2023-06-20 DIAGNOSIS — I10 Essential (primary) hypertension: Secondary | ICD-10-CM | POA: Diagnosis not present

## 2023-08-07 DIAGNOSIS — F17210 Nicotine dependence, cigarettes, uncomplicated: Secondary | ICD-10-CM | POA: Diagnosis not present

## 2023-08-07 DIAGNOSIS — I739 Peripheral vascular disease, unspecified: Secondary | ICD-10-CM | POA: Diagnosis not present

## 2023-08-07 DIAGNOSIS — Z8249 Family history of ischemic heart disease and other diseases of the circulatory system: Secondary | ICD-10-CM | POA: Diagnosis not present

## 2023-08-07 DIAGNOSIS — E785 Hyperlipidemia, unspecified: Secondary | ICD-10-CM | POA: Diagnosis not present

## 2023-08-07 DIAGNOSIS — K219 Gastro-esophageal reflux disease without esophagitis: Secondary | ICD-10-CM | POA: Diagnosis not present

## 2023-08-07 DIAGNOSIS — I10 Essential (primary) hypertension: Secondary | ICD-10-CM | POA: Diagnosis not present

## 2023-08-07 DIAGNOSIS — M545 Low back pain, unspecified: Secondary | ICD-10-CM | POA: Diagnosis not present

## 2023-08-07 DIAGNOSIS — Z008 Encounter for other general examination: Secondary | ICD-10-CM | POA: Diagnosis not present

## 2023-08-07 DIAGNOSIS — M1990 Unspecified osteoarthritis, unspecified site: Secondary | ICD-10-CM | POA: Diagnosis not present

## 2023-08-07 DIAGNOSIS — E039 Hypothyroidism, unspecified: Secondary | ICD-10-CM | POA: Diagnosis not present

## 2023-08-07 DIAGNOSIS — Z833 Family history of diabetes mellitus: Secondary | ICD-10-CM | POA: Diagnosis not present

## 2023-08-07 DIAGNOSIS — I25119 Atherosclerotic heart disease of native coronary artery with unspecified angina pectoris: Secondary | ICD-10-CM | POA: Diagnosis not present

## 2023-08-07 DIAGNOSIS — Z791 Long term (current) use of non-steroidal anti-inflammatories (NSAID): Secondary | ICD-10-CM | POA: Diagnosis not present

## 2023-10-14 DIAGNOSIS — E039 Hypothyroidism, unspecified: Secondary | ICD-10-CM | POA: Diagnosis not present

## 2023-10-14 DIAGNOSIS — R7303 Prediabetes: Secondary | ICD-10-CM | POA: Diagnosis not present

## 2023-10-14 DIAGNOSIS — E785 Hyperlipidemia, unspecified: Secondary | ICD-10-CM | POA: Diagnosis not present

## 2023-10-14 DIAGNOSIS — Z125 Encounter for screening for malignant neoplasm of prostate: Secondary | ICD-10-CM | POA: Diagnosis not present

## 2023-11-13 DIAGNOSIS — J019 Acute sinusitis, unspecified: Secondary | ICD-10-CM | POA: Diagnosis not present

## 2023-11-13 DIAGNOSIS — J029 Acute pharyngitis, unspecified: Secondary | ICD-10-CM | POA: Diagnosis not present

## 2024-02-25 DIAGNOSIS — R112 Nausea with vomiting, unspecified: Secondary | ICD-10-CM | POA: Diagnosis not present

## 2024-02-25 DIAGNOSIS — R059 Cough, unspecified: Secondary | ICD-10-CM | POA: Diagnosis not present

## 2024-02-25 DIAGNOSIS — F17210 Nicotine dependence, cigarettes, uncomplicated: Secondary | ICD-10-CM | POA: Diagnosis not present

## 2024-02-25 DIAGNOSIS — J101 Influenza due to other identified influenza virus with other respiratory manifestations: Secondary | ICD-10-CM | POA: Diagnosis not present

## 2024-02-25 DIAGNOSIS — J09X2 Influenza due to identified novel influenza A virus with other respiratory manifestations: Secondary | ICD-10-CM | POA: Diagnosis not present

## 2024-02-25 DIAGNOSIS — Z20822 Contact with and (suspected) exposure to covid-19: Secondary | ICD-10-CM | POA: Diagnosis not present

## 2024-03-27 DIAGNOSIS — E039 Hypothyroidism, unspecified: Secondary | ICD-10-CM | POA: Diagnosis not present

## 2024-03-27 DIAGNOSIS — E785 Hyperlipidemia, unspecified: Secondary | ICD-10-CM | POA: Diagnosis not present

## 2024-03-27 DIAGNOSIS — R7303 Prediabetes: Secondary | ICD-10-CM | POA: Diagnosis not present

## 2024-03-27 DIAGNOSIS — Z125 Encounter for screening for malignant neoplasm of prostate: Secondary | ICD-10-CM | POA: Diagnosis not present

## 2024-04-01 ENCOUNTER — Other Ambulatory Visit (HOSPITAL_COMMUNITY): Payer: Self-pay | Admitting: Family Medicine

## 2024-04-01 DIAGNOSIS — I10 Essential (primary) hypertension: Secondary | ICD-10-CM | POA: Diagnosis not present

## 2024-04-01 DIAGNOSIS — R7303 Prediabetes: Secondary | ICD-10-CM | POA: Diagnosis not present

## 2024-04-01 DIAGNOSIS — Z79890 Hormone replacement therapy (postmenopausal): Secondary | ICD-10-CM | POA: Diagnosis not present

## 2024-04-01 DIAGNOSIS — F17200 Nicotine dependence, unspecified, uncomplicated: Secondary | ICD-10-CM

## 2024-04-01 DIAGNOSIS — M25511 Pain in right shoulder: Secondary | ICD-10-CM | POA: Diagnosis not present

## 2024-04-01 DIAGNOSIS — I25119 Atherosclerotic heart disease of native coronary artery with unspecified angina pectoris: Secondary | ICD-10-CM | POA: Diagnosis not present

## 2024-04-01 DIAGNOSIS — E039 Hypothyroidism, unspecified: Secondary | ICD-10-CM | POA: Diagnosis not present

## 2024-04-01 DIAGNOSIS — Z0001 Encounter for general adult medical examination with abnormal findings: Secondary | ICD-10-CM | POA: Diagnosis not present

## 2024-04-01 DIAGNOSIS — D751 Secondary polycythemia: Secondary | ICD-10-CM | POA: Diagnosis not present

## 2024-04-01 DIAGNOSIS — E785 Hyperlipidemia, unspecified: Secondary | ICD-10-CM | POA: Diagnosis not present

## 2024-04-01 DIAGNOSIS — F17210 Nicotine dependence, cigarettes, uncomplicated: Secondary | ICD-10-CM | POA: Diagnosis not present

## 2024-04-01 DIAGNOSIS — K219 Gastro-esophageal reflux disease without esophagitis: Secondary | ICD-10-CM | POA: Diagnosis not present

## 2024-04-01 DIAGNOSIS — I2510 Atherosclerotic heart disease of native coronary artery without angina pectoris: Secondary | ICD-10-CM | POA: Diagnosis not present

## 2024-04-17 ENCOUNTER — Ambulatory Visit (HOSPITAL_COMMUNITY)
Admission: RE | Admit: 2024-04-17 | Discharge: 2024-04-17 | Disposition: A | Discharge status: Home / Self Care | Source: Ambulatory Visit | Attending: Family Medicine | Admitting: Family Medicine

## 2024-04-17 DIAGNOSIS — Z122 Encounter for screening for malignant neoplasm of respiratory organs: Secondary | ICD-10-CM | POA: Diagnosis not present

## 2024-04-17 DIAGNOSIS — F17210 Nicotine dependence, cigarettes, uncomplicated: Secondary | ICD-10-CM | POA: Diagnosis not present

## 2024-04-17 DIAGNOSIS — J439 Emphysema, unspecified: Secondary | ICD-10-CM | POA: Insufficient documentation

## 2024-04-17 DIAGNOSIS — I700 Atherosclerosis of aorta: Secondary | ICD-10-CM | POA: Diagnosis not present

## 2024-04-17 DIAGNOSIS — F17200 Nicotine dependence, unspecified, uncomplicated: Secondary | ICD-10-CM

## 2024-05-14 DIAGNOSIS — L820 Inflamed seborrheic keratosis: Secondary | ICD-10-CM | POA: Diagnosis not present

## 2024-09-30 DIAGNOSIS — E039 Hypothyroidism, unspecified: Secondary | ICD-10-CM | POA: Diagnosis not present

## 2024-09-30 DIAGNOSIS — R7303 Prediabetes: Secondary | ICD-10-CM | POA: Diagnosis not present

## 2024-09-30 DIAGNOSIS — E785 Hyperlipidemia, unspecified: Secondary | ICD-10-CM | POA: Diagnosis not present

## 2024-10-02 DIAGNOSIS — I25119 Atherosclerotic heart disease of native coronary artery with unspecified angina pectoris: Secondary | ICD-10-CM | POA: Diagnosis not present

## 2024-10-02 DIAGNOSIS — E039 Hypothyroidism, unspecified: Secondary | ICD-10-CM | POA: Diagnosis not present

## 2024-10-02 DIAGNOSIS — Z23 Encounter for immunization: Secondary | ICD-10-CM | POA: Diagnosis not present

## 2024-10-02 DIAGNOSIS — D751 Secondary polycythemia: Secondary | ICD-10-CM | POA: Diagnosis not present

## 2024-10-02 DIAGNOSIS — F17210 Nicotine dependence, cigarettes, uncomplicated: Secondary | ICD-10-CM | POA: Diagnosis not present

## 2024-10-02 DIAGNOSIS — E785 Hyperlipidemia, unspecified: Secondary | ICD-10-CM | POA: Diagnosis not present

## 2024-10-02 DIAGNOSIS — I10 Essential (primary) hypertension: Secondary | ICD-10-CM | POA: Diagnosis not present

## 2024-10-02 DIAGNOSIS — Z2820 Immunization not carried out because of patient decision for unspecified reason: Secondary | ICD-10-CM | POA: Diagnosis not present

## 2024-10-02 DIAGNOSIS — R7303 Prediabetes: Secondary | ICD-10-CM | POA: Diagnosis not present

## 2024-10-02 DIAGNOSIS — Z79899 Other long term (current) drug therapy: Secondary | ICD-10-CM | POA: Diagnosis not present

## 2024-10-02 DIAGNOSIS — K219 Gastro-esophageal reflux disease without esophagitis: Secondary | ICD-10-CM | POA: Diagnosis not present

## 2024-10-02 DIAGNOSIS — M25511 Pain in right shoulder: Secondary | ICD-10-CM | POA: Diagnosis not present

## 2025-01-05 ENCOUNTER — Encounter: Payer: Self-pay | Admitting: *Deleted

## 2025-01-05 NOTE — Progress Notes (Signed)
 Christian Lewis                                          MRN: [id number]   01/05/2025   The [facility] Quality Team Specialist reviewed this patient medical record for the purposes of chart review for care gap closure. The following were reviewed: chart review for care gap closure-controlling blood pressure.    [facility] Quality Team
